# Patient Record
Sex: Female | Born: 1975 | Race: Black or African American | Hispanic: No | Marital: Single | Smoking: Current every day smoker
Health system: Southern US, Community
[De-identification: ages and names within clinical notes are randomized; demographics above are authoritative.]

## PROBLEM LIST (undated history)

## (undated) DIAGNOSIS — K589 Irritable bowel syndrome without diarrhea: Secondary | ICD-10-CM

## (undated) DIAGNOSIS — I2699 Other pulmonary embolism without acute cor pulmonale: Secondary | ICD-10-CM

## (undated) DIAGNOSIS — F319 Bipolar disorder, unspecified: Secondary | ICD-10-CM

## (undated) DIAGNOSIS — K219 Gastro-esophageal reflux disease without esophagitis: Secondary | ICD-10-CM

## (undated) DIAGNOSIS — F449 Dissociative and conversion disorder, unspecified: Secondary | ICD-10-CM

## (undated) DIAGNOSIS — J45909 Unspecified asthma, uncomplicated: Secondary | ICD-10-CM

## (undated) DIAGNOSIS — F445 Conversion disorder with seizures or convulsions: Secondary | ICD-10-CM

## (undated) DIAGNOSIS — G8929 Other chronic pain: Secondary | ICD-10-CM

## (undated) DIAGNOSIS — F419 Anxiety disorder, unspecified: Secondary | ICD-10-CM

## (undated) DIAGNOSIS — R109 Unspecified abdominal pain: Secondary | ICD-10-CM

## (undated) DIAGNOSIS — F209 Schizophrenia, unspecified: Secondary | ICD-10-CM

## (undated) HISTORY — PX: CHOLECYSTECTOMY: SHX55

## (undated) HISTORY — PX: APPENDECTOMY: SHX54

## (undated) HISTORY — PX: TUBAL LIGATION: SHX77

---

## 2003-03-26 ENCOUNTER — Emergency Department (HOSPITAL_COMMUNITY): Admission: EM | Admit: 2003-03-26 | Discharge: 2003-03-26 | Payer: Self-pay | Admitting: *Deleted

## 2003-04-30 ENCOUNTER — Emergency Department (HOSPITAL_COMMUNITY): Admission: EM | Admit: 2003-04-30 | Discharge: 2003-05-01 | Payer: Self-pay | Admitting: *Deleted

## 2004-02-02 ENCOUNTER — Emergency Department (HOSPITAL_COMMUNITY): Admission: EM | Admit: 2004-02-02 | Discharge: 2004-02-02 | Payer: Self-pay | Admitting: Emergency Medicine

## 2004-05-12 ENCOUNTER — Emergency Department (HOSPITAL_COMMUNITY): Admission: EM | Admit: 2004-05-12 | Discharge: 2004-05-12 | Payer: Self-pay | Admitting: Emergency Medicine

## 2004-05-12 ENCOUNTER — Inpatient Hospital Stay (HOSPITAL_COMMUNITY): Admission: AD | Admit: 2004-05-12 | Discharge: 2004-05-18 | Payer: Self-pay | Admitting: Internal Medicine

## 2004-10-07 ENCOUNTER — Emergency Department (HOSPITAL_COMMUNITY): Admission: EM | Admit: 2004-10-07 | Discharge: 2004-10-07 | Payer: Self-pay | Admitting: Emergency Medicine

## 2005-06-05 ENCOUNTER — Emergency Department (HOSPITAL_COMMUNITY): Admission: EM | Admit: 2005-06-05 | Discharge: 2005-06-05 | Payer: Self-pay | Admitting: Emergency Medicine

## 2007-04-21 ENCOUNTER — Emergency Department (HOSPITAL_COMMUNITY): Admission: EM | Admit: 2007-04-21 | Discharge: 2007-04-21 | Payer: Self-pay | Admitting: Emergency Medicine

## 2008-01-30 ENCOUNTER — Emergency Department (HOSPITAL_COMMUNITY): Admission: EM | Admit: 2008-01-30 | Discharge: 2008-01-30 | Payer: Self-pay | Admitting: Emergency Medicine

## 2008-06-18 ENCOUNTER — Emergency Department: Payer: Self-pay | Admitting: Emergency Medicine

## 2008-07-14 ENCOUNTER — Emergency Department (HOSPITAL_COMMUNITY): Admission: EM | Admit: 2008-07-14 | Discharge: 2008-07-14 | Payer: Self-pay | Admitting: Emergency Medicine

## 2008-12-26 ENCOUNTER — Inpatient Hospital Stay (HOSPITAL_COMMUNITY): Admission: EM | Admit: 2008-12-26 | Discharge: 2008-12-30 | Payer: Self-pay | Admitting: Internal Medicine

## 2008-12-26 ENCOUNTER — Encounter: Payer: Self-pay | Admitting: Emergency Medicine

## 2009-03-26 ENCOUNTER — Emergency Department (HOSPITAL_COMMUNITY): Admission: EM | Admit: 2009-03-26 | Discharge: 2009-03-26 | Payer: Self-pay | Admitting: Emergency Medicine

## 2009-09-18 ENCOUNTER — Emergency Department (HOSPITAL_COMMUNITY): Admission: EM | Admit: 2009-09-18 | Discharge: 2009-09-18 | Payer: Self-pay | Admitting: Emergency Medicine

## 2009-11-23 ENCOUNTER — Emergency Department (HOSPITAL_COMMUNITY): Admission: EM | Admit: 2009-11-23 | Discharge: 2009-11-23 | Payer: Self-pay | Admitting: Emergency Medicine

## 2010-01-06 ENCOUNTER — Telehealth (INDEPENDENT_AMBULATORY_CARE_PROVIDER_SITE_OTHER): Payer: Self-pay

## 2010-01-07 ENCOUNTER — Emergency Department (HOSPITAL_COMMUNITY): Admission: EM | Admit: 2010-01-07 | Discharge: 2010-01-07 | Payer: Self-pay | Admitting: Emergency Medicine

## 2010-01-11 ENCOUNTER — Emergency Department (HOSPITAL_COMMUNITY): Admission: EM | Admit: 2010-01-11 | Discharge: 2010-01-11 | Payer: Self-pay | Admitting: Emergency Medicine

## 2010-01-12 ENCOUNTER — Encounter (INDEPENDENT_AMBULATORY_CARE_PROVIDER_SITE_OTHER): Payer: Self-pay | Admitting: *Deleted

## 2010-01-22 DIAGNOSIS — R51 Headache: Secondary | ICD-10-CM

## 2010-01-22 DIAGNOSIS — R109 Unspecified abdominal pain: Secondary | ICD-10-CM | POA: Insufficient documentation

## 2010-01-22 DIAGNOSIS — R519 Headache, unspecified: Secondary | ICD-10-CM | POA: Insufficient documentation

## 2010-01-22 DIAGNOSIS — E876 Hypokalemia: Secondary | ICD-10-CM | POA: Insufficient documentation

## 2010-01-25 ENCOUNTER — Ambulatory Visit: Payer: Self-pay | Admitting: Gastroenterology

## 2010-01-25 DIAGNOSIS — J45909 Unspecified asthma, uncomplicated: Secondary | ICD-10-CM | POA: Insufficient documentation

## 2010-01-25 DIAGNOSIS — K5909 Other constipation: Secondary | ICD-10-CM

## 2010-01-25 DIAGNOSIS — D649 Anemia, unspecified: Secondary | ICD-10-CM

## 2010-01-25 DIAGNOSIS — K219 Gastro-esophageal reflux disease without esophagitis: Secondary | ICD-10-CM | POA: Insufficient documentation

## 2010-01-25 DIAGNOSIS — F319 Bipolar disorder, unspecified: Secondary | ICD-10-CM

## 2010-01-25 DIAGNOSIS — F411 Generalized anxiety disorder: Secondary | ICD-10-CM | POA: Insufficient documentation

## 2010-01-25 DIAGNOSIS — R011 Cardiac murmur, unspecified: Secondary | ICD-10-CM

## 2010-01-25 DIAGNOSIS — F200 Paranoid schizophrenia: Secondary | ICD-10-CM | POA: Insufficient documentation

## 2010-02-03 ENCOUNTER — Encounter: Payer: Self-pay | Admitting: Urgent Care

## 2010-02-10 ENCOUNTER — Telehealth (INDEPENDENT_AMBULATORY_CARE_PROVIDER_SITE_OTHER): Payer: Self-pay

## 2010-02-11 ENCOUNTER — Encounter: Payer: Self-pay | Admitting: Internal Medicine

## 2010-02-17 ENCOUNTER — Emergency Department (HOSPITAL_COMMUNITY): Admission: EM | Admit: 2010-02-17 | Discharge: 2010-02-18 | Payer: Self-pay | Admitting: Emergency Medicine

## 2010-02-19 ENCOUNTER — Ambulatory Visit: Payer: Self-pay | Admitting: Internal Medicine

## 2010-02-19 ENCOUNTER — Ambulatory Visit (HOSPITAL_COMMUNITY): Admission: RE | Admit: 2010-02-19 | Discharge: 2010-02-19 | Payer: Self-pay | Admitting: Internal Medicine

## 2010-02-24 ENCOUNTER — Encounter: Payer: Self-pay | Admitting: Internal Medicine

## 2010-11-09 NOTE — Progress Notes (Signed)
Summary: phone note/constipation  Phone Note Call from Patient   Caller: Patient Summary of Call: VM from Pt. Said she had to cancel her appt (with Dr. Jena Gauss) yesterday. Said she had a death in her family. She has rescheduled for 01/25/2010 with Lorenza Burton, NP. She said she is having issues with constipation and wanted to know what she can do. I called her and told her that we can not prescibe for her since she is not a pt yet. Advise her to check with PCP until her appt here.  Initial call taken by: Cloria Spring LPN,  January 06, 2010 8:48 AM     Appended Document: phone note/constipation fully agree  Appended Document: phone note/constipation Pt stated she would like to be seen ASAP.Marland KitchenMarland KitchenI had a cancellation with Dr Darrick Penna on [facility] 01/13/10.Pt. stated she would like to see Dr Darrick Penna..Pt has never been seen here before.  Appended Document: phone note/constipation Seeing pt for add on procedure. 830 slot not available.  Appended Document: phone note/constipation pt rescheduled to 01/25/10 with KJ for Dr Jena Gauss.Marland KitchenMarland KitchenDr Jena Gauss saw the pt in 2005 as an inpatient.

## 2010-11-09 NOTE — Assessment & Plan Note (Signed)
Summary: npp,persistent rt sided building. glu   Visit Type:  Initial Consult Referring Provider:  Dr. Emelda Fear Primary Care Provider:  Health Department  Chief Complaint:  persistent right side pain.  History of Present Illness: 35 y/o female referred for chronic abd pain.  Pt c/o chronic constipation/IBS.  No BM x 17 days.  Went to ER, was given laxative, no BM.  Took colon prep 2-3 weeks ago.  c/o hard stools small ball-like.  c/o nausea & vomiting intermitantly.  "Scared to eat"  Weight gain 16# in 1-2 weeks per pt. c/o RUQ abd pain, radiates around to LLQ.  constant stabbing pain, 10/10 daily.  s/p appendectomy 2002 & FAILED tubal ligation 1999.  U Preg at Dr Emelda Fear in 3/2011negative per pt.   Occasional heartburn, denies dysphagia or odynophagia  5x/week, does not take meds for this.  Taking tylenol for pain, cannot sleep at night without tylenol PM.  Evaluation by Dr Emelda Fear reveals no gyn etiology.  CT abd/pelvis w/IV contrast 01/11/2010->Tiny umbilical hernia containing fat.   Small amount nonspecific low attenuation free pelvic fluid.   Question small right ovarian cyst versus loculated fluid in right   adnexa.   Question prior appendectomy  It is notable that pt has had A/P CT's in April, Feb 2011, Dec 2010, & Mar 2010. 4/4 U hcg negative, UA negative, CMP, lipase normal WBC 5.6, HGB 11.7, MCV 34.3, MCV 87.5, 296  Current Problems (verified): 1)  Anxiety  (ICD-300.00) 2)  Bipolar Affective Disorder  (ICD-296.80) 3)  Schizophrenia, Paranoid  (ICD-295.30) 4)  Asthma  (ICD-493.90) 5)  Gerd  (ICD-530.81) 6)  Constipation, Chronic  (ICD-564.09) 7)  Heart Murmur, Benign  (ICD-785.2) 8)  Hypokalemia  (ICD-276.8) 9)  Abdominal Pain  (ICD-789.00) 10)  Hx of Headache  (ICD-784.0)  Current Medications (verified): 1)  None  Allergies (verified): 1)  ! Amoxicillin 2)  ! Ibuprofen 3)  ! Sulfa  Past History:  Past Medical History: ANXIETY (ICD-300.00) BIPOLAR AFFECTIVE  DISORDER (ICD-296.80) SCHIZOPHRENIA, PARANOID (ICD-295.30) ASTHMA (ICD-493.90) GERD (ICD-530.81) CONSTIPATION, CHRONIC (ICD-564.09) HEART MURMUR, BENIGN (ICD-785.2) HYPOKALEMIA (ICD-276.8) ABDOMINAL PAIN (ICD-789.00) Hx of HEADACHE (ICD-784.0)  Past Surgical History: TUBAL LIGATION 1999 (pt states failed, pregnant w/ twins post procedure and had SAB) APPENDECTOMY 2002  Family History: No known family history of colorectal carcinoma, IBD, liver or chronic GI problems. Father: (84) HTN Mother:(deceased 1's) massive MI/CVA   Siblings: lost 1 brother MVA,  1 brother-htn  Social History: Engaged to be married this summer lives w/ significant other Has 3 healthy children who live w/ pt's father & step-mother  Patient currently smokes. 22pkyr hx Hx heavy alcohol x 12 yrs, 3 6pks/day, quit 6 yrs ago Hx crack/cocaine use, none in 6 yrs   Smoking Status:  current  Review of Systems      See HPI General:  Denies fever, chills, sweats, anorexia, fatigue, weakness, malaise, weight loss, and sleep disorder. CV:  Denies chest pains, angina, palpitations, syncope, dyspnea on exertion, orthopnea, PND, peripheral edema, and claudication. Resp:  Denies dyspnea at rest, dyspnea with exercise, cough, sputum, wheezing, coughing up blood, and pleurisy. GI:  Denies difficulty swallowing, pain on swallowing, jaundice, bloody BM's, black BMs, and fecal incontinence. GU:  Denies urinary burning, blood in urine, nocturnal urination, urinary frequency, urinary incontinence, abnormal vaginal bleeding, amenorrhea, menorrhagia, and vaginal discharge. Derm:  Denies rash, itching, dry skin, hives, moles, warts, and unhealing ulcers. Psych:  Denies depression, anxiety, memory loss, suicidal ideation, hallucinations, paranoia, phobia, and confusion. Heme:  Denies bruising, bleeding, and enlarged lymph nodes.  Vital Signs:  Patient profile:   35 year old female Height:      68 inches Weight:      266  pounds BMI:     40.59 Temp:     97.7 degrees F oral Pulse rate:   80 / minute BP sitting:   110 / 70  (left arm) Cuff size:   large  Vitals Entered By: Cloria Spring LPN (January 25, 2010 2:18 PM)  Physical Exam  General:  Obese, no acute distress. Head:  Normocephalic and atraumatic. Eyes:  Sclera clear, no icterus. Ears:  Normal auditory acuity. Nose:  No deformity, discharge,  or lesions. Mouth:  No deformity or lesions, dentition normal. Neck:  Supple; no masses or thyromegaly. Lungs:  Clear throughout to auscultation. Heart:  Regular rate and rhythm; no murmurs, rubs,  or bruits. Abdomen:  Obese, Soft, nontender and nondistended. No masses, hepatosplenomegaly or hernias noted. Normal bowel sounds.without guarding and without rebound.   Rectal:  hemocult negative.  No external/internal lesions/masses noted. Msk:  Symmetrical with no gross deformities. Normal posture. Pulses:  Normal pulses noted. Extremities:  No clubbing, cyanosis, edema or deformities noted. Neurologic:  Alert and  oriented x4;  grossly normal neurologically. Skin:  Intact without significant lesions or rashes. Cervical Nodes:  No significant cervical adenopathy. Psych:  Alert and cooperative. Normal mood and affect.  Impression & Recommendations:  Problem # 1:  CONSTIPATION, CHRONIC (ICD-564.09) 35 Y/O Black female w/ chronic constipation and abd pain, previous work-up w/ CT and GYN evaluation negative.  I suspect functional component/IBS.  She has normocytic anemia, hemoccult negative stool today.  Will attempt to treat constipation w/ miralax.    Orders: Consultation Level III ([address])  Problem # 2:  ABDOMINAL PAIN (ICD-789.00) See #1  Problem # 3:  ANEMIA (ICD-285.9) See #1  Patient Instructions: 1)  iFOBT 2)  Begin glycolax, if no improvement would try amitiza. 3)  Samples Restora once daily given 4)  Call if pain persists. 5)  Office visit Dr Jena Gauss 60month 6)  Consider checking TSH and repeat  CBC at that time. Prescriptions: RESTORA  CAPS (PROBIOTIC PRODUCT) begin once po daily  #31 x 2   Entered and Authorized by:   Joselyn Arrow FNP-BC   Signed by:   Joselyn Arrow FNP-BC on 01/27/2010   Method used:   Electronically to        [facility] (retail)       [address]       [place], [place]  [address]       Ph: [phone]       Fax: [phone]   RxID:   [id number] GLYCOLAX  POWD (POLYETHYLENE GLYCOL 3350) 17 grams by mouth daily as needed for constipation, can take 5 tsp in 4oz q hr x 5 hrs until BM today  #527 grams x 11   Entered and Authorized by:   Joselyn Arrow FNP-BC   Signed by:   Joselyn Arrow FNP-BC on 01/27/2010   Method used:   Electronically to        [facility] (retail)       [address]       [place], [place]  [address]       Ph: [phone]       Fax: [phone]  RxID:   [id number]   Appended Document: Orders Update    Clinical Lists Changes  Orders: Added new Service order of Hemoccult Cards -3 specimans (take home) (93235) - Signed      Appended Document: npp,persistent rt sided building. glu She needs a tcs; please set up for next week  Appended Document: npp,persistent rt sided building. glu Pt informed.   Appended Document: npp,persistent rt sided building. glu I called pt to schedule tcs,no answer,lmom.  Appended Document: npp,persistent rt sided building. glu TCS planned given new onset bloody stools  Appended Document: npp,persistent rt sided building. glu pt aware of appt w/RMR on 03/02/10@4pm

## 2010-11-09 NOTE — Letter (Signed)
Summary: TCS ORDER  TCS ORDER   Imported By: Ave Filter 02/11/2010 11:24:28  _____________________________________________________________________  External Attachment:    Type:   Image     Comment:   External Document

## 2010-11-09 NOTE — Letter (Signed)
Summary: Patient Notice, Colon Biopsy Results  [facility]  [address]   [place], [place] [address]   Phone: [phone]  Fax: [phone]       Feb 24, 2010   Leah Gardner [address] [place], [place]  [address] 12/31/1975    Dear Ms. Gardner,  I am pleased to inform you that the biopsies taken during your recent colonoscopy did not show any evidence of cancer upon pathologic examination.  Additional information/recommendations:  You should have a repeat colonoscopy examination  age 13.  Please call us if you are having persistent problems or have questions about your condition that have not been fully answered at this time.  Sincerely,    R. Roetta Sessions MD, FACP [facility]  [facility] Ph: [phone]    Fax: [phone]   Appended Document: Patient Notice, Colon Biopsy Results rEMINDER PLACED IN IDX.

## 2010-11-09 NOTE — Progress Notes (Signed)
Summary: phone note/ watery, bloody diarrhea  Phone Note Call from Patient   Caller: Patient Summary of Call: Pt called and said she has been having diarrhea siince about 3:OO AM this morning. She said it is more bloody water than  stool. She was having some abdominal pain, but it some better. Her next appt here is on 5/247/2011 with Dr. Jena Gauss. Please advise! Initial call taken by: Cloria Spring LPN,  Feb 10, 1609 3:04 PM     Appended Document: phone note/ watery, bloody diarrhea would also back off on Gycolax if she has been taking it recently  Appended Document: phone note/ watery, bloody diarrhea Pt informed.

## 2010-11-09 NOTE — Letter (Signed)
Summary: Scheduled Appointment  [facility]  [address]   [place], [place] [address]   Phone: [phone]  Fax: [phone]    January 12, 2010   Dear: Annett Gula PETTIFORD            DOB: 1976-06-26    I have been instructed to schedule you an appointment in our office.  Your appointment is as follows:   Date: April 18,2011  Time: 2:00pm    Please be here 15 minutes early.   Provider: Lorenza Burton FNP    Please contact the office if you need to reschedule this appointment for a more convenient time.   Thank you,    [facility] Ph: [phone]   Fax: [phone]

## 2010-11-09 NOTE — Letter (Signed)
Summary: External Other  External Other   Imported By: Peggyann Shoals 02/03/2010 10:16:41  _____________________________________________________________________  External Attachment:    Type:   Image     Comment:   External Document

## 2010-12-28 LAB — PREGNANCY, URINE: Preg Test, Ur: NEGATIVE

## 2010-12-28 LAB — BASIC METABOLIC PANEL
BUN: 15 mg/dL (ref 6–23)
CO2: 23 mEq/L (ref 19–32)
Calcium: 9.2 mg/dL (ref 8.4–10.5)
Chloride: 111 mEq/L (ref 96–112)
Creatinine, Ser: 0.91 mg/dL (ref 0.4–1.2)
GFR calc Af Amer: >60 mL/min (ref >60)
GFR calc non Af Amer: >60 mL/min (ref >60)
Glucose, Bld: 101 mg/dL — ABNORMAL HIGH (ref 70–99)
Potassium: 3.6 mEq/L (ref 3.5–5.1)
Sodium: 139 mEq/L (ref 135–145)

## 2010-12-28 LAB — URINALYSIS, ROUTINE W REFLEX MICROSCOPIC
Bilirubin Urine: NEGATIVE
Glucose, UA: NEGATIVE mg/dL
Ketones, ur: NEGATIVE mg/dL
Leukocytes, UA: NEGATIVE
Nitrite: NEGATIVE
Protein, ur: NEGATIVE mg/dL
Specific Gravity, Urine: >1.03 — ABNORMAL HIGH (ref 1.005–1.030)
Urobilinogen, UA: 0.2 mg/dL (ref 0.0–1.0)
pH: 5.5 (ref 5.0–8.0)

## 2010-12-28 LAB — WET PREP, GENITAL
Trich, Wet Prep: NONE SEEN
Yeast Wet Prep HPF POC: NONE SEEN

## 2010-12-28 LAB — URINE MICROSCOPIC-ADD ON

## 2010-12-28 LAB — DIFFERENTIAL
Lymphs Abs: 2.9 10*3/uL (ref 0.7–4.0)
Monocytes Relative: 10 % (ref 3–12)
Neutro Abs: 2.2 10*3/uL (ref 1.7–7.7)
Neutrophils Relative %: 38 % — ABNORMAL LOW (ref 43–77)

## 2010-12-28 LAB — CBC
MCV: 86 fL (ref 78.0–100.0)
RBC: 3.82 MIL/uL — ABNORMAL LOW (ref 3.87–5.11)
WBC: 5.8 10*3/uL (ref 4.0–10.5)

## 2010-12-28 LAB — GC/CHLAMYDIA PROBE AMP, GENITAL: Chlamydia, DNA Probe: NEGATIVE

## 2010-12-29 LAB — BASIC METABOLIC PANEL
CO2: 23 mEq/L (ref 19–32)
CO2: 21 mEq/L (ref 19–32)
Chloride: 110 mEq/L (ref 96–112)
GFR calc Af Amer: >60 mL/min (ref >60)
GFR calc non Af Amer: >60 mL/min (ref >60)
Glucose, Bld: 101 mg/dL — ABNORMAL HIGH (ref 70–99)
Potassium: 3.9 mEq/L (ref 3.5–5.1)
Potassium: 3.8 mEq/L (ref 3.5–5.1)
Sodium: 137 mEq/L (ref 135–145)
Sodium: 139 mEq/L (ref 135–145)

## 2010-12-29 LAB — DIFFERENTIAL
Basophils Absolute: 0 10*3/uL (ref 0.0–0.1)
Basophils Relative: 0 % (ref 0–1)
Eosinophils Absolute: 0 10*3/uL (ref 0.0–0.7)
Eosinophils Relative: 1 % (ref 0–5)
Lymphocytes Relative: 48 % — ABNORMAL HIGH (ref 12–46)
Lymphs Abs: 2.5 10*3/uL (ref 0.7–4.0)
Monocytes Absolute: 0.5 10*3/uL (ref 0.1–1.0)
Monocytes Absolute: 0.3 10*3/uL (ref 0.1–1.0)
Monocytes Relative: 9 % (ref 3–12)
Monocytes Relative: 7 % (ref 3–12)

## 2010-12-29 LAB — CBC
HCT: 34.3 % — ABNORMAL LOW (ref 36.0–46.0)
HCT: 36.7 % (ref 36.0–46.0)
Hemoglobin: 11.7 g/dL — ABNORMAL LOW (ref 12.0–15.0)
Hemoglobin: 12.4 g/dL (ref 12.0–15.0)
MCHC: 34.3 g/dL (ref 30.0–36.0)
MCHC: 33.9 g/dL (ref 30.0–36.0)
MCV: 87.7 fL (ref 78.0–100.0)
RBC: 3.92 MIL/uL (ref 3.87–5.11)
RBC: 4.18 MIL/uL (ref 3.87–5.11)
RDW: 15.3 % (ref 11.5–15.5)
WBC: 4.1 10*3/uL (ref 4.0–10.5)

## 2010-12-29 LAB — GC/CHLAMYDIA PROBE AMP, GENITAL
Chlamydia, DNA Probe: NEGATIVE
GC Probe Amp, Genital: NEGATIVE

## 2010-12-29 LAB — URINALYSIS, ROUTINE W REFLEX MICROSCOPIC
Bilirubin Urine: NEGATIVE
Glucose, UA: NEGATIVE mg/dL
Hgb urine dipstick: NEGATIVE
Hgb urine dipstick: NEGATIVE
Protein, ur: NEGATIVE mg/dL
Specific Gravity, Urine: 1.01 (ref 1.005–1.030)
pH: 6 (ref 5.0–8.0)
pH: 6 (ref 5.0–8.0)

## 2010-12-29 LAB — HEPATIC FUNCTION PANEL
Alkaline Phosphatase: 31 U/L — ABNORMAL LOW (ref 39–117)
Bilirubin, Direct: 0.2 mg/dL (ref 0.0–0.3)
Total Bilirubin: 0.9 mg/dL (ref 0.3–1.2)

## 2010-12-29 LAB — WET PREP, GENITAL
Trich, Wet Prep: NONE SEEN
Yeast Wet Prep HPF POC: NONE SEEN

## 2011-01-02 LAB — URINALYSIS, ROUTINE W REFLEX MICROSCOPIC
Glucose, UA: NEGATIVE mg/dL
Hgb urine dipstick: NEGATIVE
Protein, ur: NEGATIVE mg/dL
Specific Gravity, Urine: 1.015 (ref 1.005–1.030)
Urobilinogen, UA: 0.2 mg/dL (ref 0.0–1.0)

## 2011-01-02 LAB — CBC
MCV: 87.2 fL (ref 78.0–100.0)
Platelets: 302 10*3/uL (ref 150–400)
RBC: 3.8 MIL/uL — ABNORMAL LOW (ref 3.87–5.11)
WBC: 6.5 10*3/uL (ref 4.0–10.5)

## 2011-01-02 LAB — COMPREHENSIVE METABOLIC PANEL
ALT: 17 U/L (ref 0–35)
AST: 15 U/L (ref 0–37)
Albumin: 3.5 g/dL (ref 3.5–5.2)
Alkaline Phosphatase: 39 U/L (ref 39–117)
CO2: 24 mEq/L (ref 19–32)
Chloride: 110 mEq/L (ref 96–112)
GFR calc Af Amer: >60 mL/min (ref >60)
GFR calc non Af Amer: >60 mL/min (ref >60)
Potassium: 3.6 mEq/L (ref 3.5–5.1)
Total Bilirubin: 0.7 mg/dL (ref 0.3–1.2)

## 2011-01-02 LAB — DIFFERENTIAL
Basophils Absolute: 0 10*3/uL (ref 0.0–0.1)
Basophils Relative: 0 % (ref 0–1)
Eosinophils Absolute: 0.1 10*3/uL (ref 0.0–0.7)
Eosinophils Relative: 1 % (ref 0–5)
Monocytes Absolute: 0.4 10*3/uL (ref 0.1–1.0)

## 2011-01-02 LAB — LIPASE, BLOOD: Lipase: 42 U/L (ref 11–59)

## 2011-01-11 LAB — URINALYSIS, ROUTINE W REFLEX MICROSCOPIC
Bilirubin Urine: NEGATIVE
Ketones, ur: NEGATIVE mg/dL
Nitrite: NEGATIVE
Protein, ur: NEGATIVE mg/dL
pH: 6 (ref 5.0–8.0)

## 2011-01-11 LAB — DIFFERENTIAL
Lymphs Abs: 2.7 10*3/uL (ref 0.7–4.0)
Monocytes Relative: 8 % (ref 3–12)
Neutro Abs: 2.1 10*3/uL (ref 1.7–7.7)
Neutrophils Relative %: 40 % — ABNORMAL LOW (ref 43–77)

## 2011-01-11 LAB — CBC
Hemoglobin: 11.4 g/dL — ABNORMAL LOW (ref 12.0–15.0)
MCHC: 33.3 g/dL (ref 30.0–36.0)
Platelets: 259 10*3/uL (ref 150–400)
RDW: 15 % (ref 11.5–15.5)

## 2011-01-11 LAB — COMPREHENSIVE METABOLIC PANEL
BUN: 14 mg/dL (ref 6–23)
Calcium: 8.7 mg/dL (ref 8.4–10.5)
Glucose, Bld: 92 mg/dL (ref 70–99)
Sodium: 141 mEq/L (ref 135–145)
Total Protein: 6.8 g/dL (ref 6.0–8.3)

## 2011-01-11 LAB — PREGNANCY, URINE: Preg Test, Ur: NEGATIVE

## 2011-01-20 LAB — BASIC METABOLIC PANEL
BUN: 10 mg/dL (ref 6–23)
BUN: 4 mg/dL — ABNORMAL LOW (ref 6–23)
CO2: 22 mEq/L (ref 19–32)
CO2: 22 mEq/L (ref 19–32)
CO2: 25 mEq/L (ref 19–32)
Calcium: 8.5 mg/dL (ref 8.4–10.5)
Calcium: 8.8 mg/dL (ref 8.4–10.5)
Chloride: 100 mEq/L (ref 96–112)
Chloride: 101 mEq/L (ref 96–112)
Chloride: 107 mEq/L (ref 96–112)
Chloride: 111 mEq/L (ref 96–112)
Creatinine, Ser: 1.16 mg/dL (ref 0.4–1.2)
Creatinine, Ser: 0.96 mg/dL (ref 0.4–1.2)
Creatinine, Ser: 1.02 mg/dL (ref 0.4–1.2)
Creatinine, Ser: 1.1 mg/dL (ref 0.4–1.2)
GFR calc Af Amer: >60 mL/min (ref >60)
GFR calc Af Amer: >60 mL/min (ref >60)
GFR calc Af Amer: >60 mL/min (ref >60)
GFR calc non Af Amer: 54 mL/min — ABNORMAL LOW (ref >60)
GFR calc non Af Amer: 57 mL/min — ABNORMAL LOW (ref >60)
GFR calc non Af Amer: >60 mL/min (ref >60)
GFR calc non Af Amer: >60 mL/min (ref >60)
Glucose, Bld: 103 mg/dL — ABNORMAL HIGH (ref 70–99)
Glucose, Bld: 104 mg/dL — ABNORMAL HIGH (ref 70–99)
Glucose, Bld: 90 mg/dL (ref 70–99)
Potassium: 3.8 mEq/L (ref 3.5–5.1)
Potassium: 3.9 mEq/L (ref 3.5–5.1)
Sodium: 137 mEq/L (ref 135–145)

## 2011-01-20 LAB — COMPREHENSIVE METABOLIC PANEL
AST: 15 U/L (ref 0–37)
Albumin: 3 g/dL — ABNORMAL LOW (ref 3.5–5.2)
Alkaline Phosphatase: 45 U/L (ref 39–117)
Chloride: 105 mEq/L (ref 96–112)
GFR calc Af Amer: >60 mL/min (ref >60)
Potassium: 3.9 mEq/L (ref 3.5–5.1)
Total Bilirubin: 0.8 mg/dL (ref 0.3–1.2)
Total Protein: 7 g/dL (ref 6.0–8.3)

## 2011-01-20 LAB — CBC
HCT: 28.5 % — ABNORMAL LOW (ref 36.0–46.0)
Hemoglobin: 9.7 g/dL — ABNORMAL LOW (ref 12.0–15.0)
Hemoglobin: 9.7 g/dL — ABNORMAL LOW (ref 12.0–15.0)
MCHC: 34 g/dL (ref 30.0–36.0)
MCHC: 34.1 g/dL (ref 30.0–36.0)
MCHC: 34.3 g/dL (ref 30.0–36.0)
MCV: 87.5 fL (ref 78.0–100.0)
MCV: 88 fL (ref 78.0–100.0)
MCV: 88.1 fL (ref 78.0–100.0)
MCV: 88.3 fL (ref 78.0–100.0)
Platelets: 222 10*3/uL (ref 150–400)
Platelets: 183 10*3/uL (ref 150–400)
Platelets: 197 10*3/uL (ref 150–400)
Platelets: 314 10*3/uL (ref 150–400)
RBC: 3.23 MIL/uL — ABNORMAL LOW (ref 3.87–5.11)
RBC: 3.58 MIL/uL — ABNORMAL LOW (ref 3.87–5.11)
RDW: 14.7 % (ref 11.5–15.5)
RDW: 14.9 % (ref 11.5–15.5)
RDW: 14.9 % (ref 11.5–15.5)
WBC: 13.7 10*3/uL — ABNORMAL HIGH (ref 4.0–10.5)
WBC: 12.4 10*3/uL — ABNORMAL HIGH (ref 4.0–10.5)

## 2011-01-20 LAB — CULTURE, BLOOD (ROUTINE X 2)
Culture: NO GROWTH
Culture: NO GROWTH
Report Status: 3242010

## 2011-01-20 LAB — URINE MICROSCOPIC-ADD ON

## 2011-01-20 LAB — GC/CHLAMYDIA PROBE AMP, GENITAL: GC Probe Amp, Genital: NEGATIVE

## 2011-01-20 LAB — PREGNANCY, URINE: Preg Test, Ur: NEGATIVE

## 2011-01-20 LAB — HEPATITIS PANEL, ACUTE
HCV Ab: NEGATIVE
Hep A IgM: NEGATIVE
Hepatitis B Surface Ag: NEGATIVE

## 2011-01-20 LAB — GC PROBE AMPLIFICATION, URINE: GC Probe Amp, Urine: NEGATIVE

## 2011-01-20 LAB — URINALYSIS, ROUTINE W REFLEX MICROSCOPIC
Bilirubin Urine: NEGATIVE
Glucose, UA: NEGATIVE mg/dL
Nitrite: POSITIVE — AB
Specific Gravity, Urine: 1.015 (ref 1.005–1.030)
Specific Gravity, Urine: 1.025 (ref 1.005–1.030)
Urobilinogen, UA: 0.2 mg/dL (ref 0.0–1.0)
pH: 5.5 (ref 5.0–8.0)
pH: 6 (ref 5.0–8.0)

## 2011-01-20 LAB — URINE CULTURE

## 2011-01-20 LAB — DIFFERENTIAL
Basophils Absolute: 0 10*3/uL (ref 0.0–0.1)
Eosinophils Absolute: 0 10*3/uL (ref 0.0–0.7)
Lymphs Abs: 1.1 10*3/uL (ref 0.7–4.0)
Neutrophils Relative %: 84 % — ABNORMAL HIGH (ref 43–77)

## 2011-01-20 LAB — RPR: RPR Ser Ql: NONREACTIVE

## 2011-01-20 LAB — WET PREP, GENITAL: Yeast Wet Prep HPF POC: NONE SEEN

## 2011-01-20 LAB — T4, FREE: Free T4: 0.82 ng/dL — ABNORMAL LOW (ref 0.89–1.80)

## 2011-01-20 LAB — CHLAMYDIA PROBE AMPLIFICATION, URINE: Chlamydia, Swab/Urine, PCR: NEGATIVE

## 2011-02-22 NOTE — H&P (Signed)
Leah Gardner, MARSCHNER            ACCOUNT NO.:  [id number]   MEDICAL RECORD NO.:  [id number]          PATIENT TYPE:  INP   LOCATION:  5158                         FACILITY:  [facility]   PHYSICIAN:  Elliot Cousin, M.D.    DATE OF BIRTH:  12/07/1975   DATE OF ADMISSION:  12/26/2008  DATE OF DISCHARGE:                              HISTORY & PHYSICAL   PRIMARY CARE PHYSICIAN:  The patient is unassigned.   .   CHIEF COMPLAINT:  Painful urination, left pelvic and flank pain, chills,  nausea and vomiting.  The patient was transferred from [facility] Emergency Department for further evaluation of an ovarian mass,  query tuboovarian abscess.   HISTORY OF PRESENT ILLNESS:  The patient is a 35 year old woman with a  past medical history significant for pyelonephritis, urinary tract  infections and gestational diabetes mellitus.  She presented to the  emergency department at [facility] this morning with a chief  complaint of painful urination, left pelvic and flank pain, chills,  nausea and vomiting.  Her symptoms started approximately 3 days ago.  They became progressively worse.  The pain is described as sharp and is  primarily located in the lower left abdomen and left flank area.  She  says at times, the pain is severe.  Initially, the pain was  intermittent.  However, it has been constant over the past 24 hours.  She denies vaginal or urethral discharge.  She does have pain with  urination, but no burning or itching.  She has had no associated  diarrhea, constipation or bright red blood per rectum.  She has had  several episodes of nausea and vomiting without any evidence of coffee-  grounds emesis.  Her last menstrual period was in December 2009.  Her  last sexual encounter was in January 2010.  She says that her menstrual  periods have always been irregular.  She does have a history of a  bilateral tubal ligation.   During the evaluation by the emergency department  physician and nurse  practitioner at [facility], it was noted that the patient was  febrile with a temperature 101.8 and initially tachycardiac with a heart  rate of 111.  Her urine pregnancy test was negative.  Her urinalysis  revealed moderate blood, 100 protein, positive nitrites and moderate  leukocytes.  A pelvic exam was performed and the wet prep revealed a few  trichomonads,  many clue cells, and few WBCs.  A pelvic ultrasound was  ordered and the results revealed a small amount of free fluid is seen in  the adjacent areas to both ovaries as well as within the cul-de-sac; the  fluid adjacent to the left ovary appears slightly complex; 1.8 cm  slightly complex left ovarian cystic structure; adjacent to the left  ovary is a small complex cystic/septated structure which is not part of  the ovary and does not appear to be the tube; exact etiology is  indeterminate.  Because there was a concern about a possible tuboovarian  abscess, the patient was transferred to [facility] where a  gynecological evaluation  can be obtained.   PAST MEDICAL HISTORY:  1. History of gestational diabetes.  2. History of urinary tract infections and kidney infections,      particularly during her previous pregnancies.  3. Tobacco use.  4. History of alcohol abuse, she stopped approximately 1 year ago.  5. History of migraine headaches  6. Status post bilateral tubal ligation.  7. Status post appendectomy.   MEDICATIONS:  None prescribed.  However, she has taken over-the-counter  Tylenol for several days with minimal relief.   ALLERGIES:  1. The patient has an allergy to [facility] which causes hives.  2. She also has a allergy to SULFA DRUGS which also causes hives.   SOCIAL HISTORY:  The patient is single.  [place]  She has 3 children.  She smokes 1 pack of cigarettes per day.  She has been abstinent from alcohol use for 1 year.  In the past, she   would drink 2 six-packs of beer daily.  She is currently unemployed.  She receives financial support from her father.   FAMILY HISTORY:  Her father is 85 years of age and has hypertension.  Her mother died of a myocardial infarction and stroke at 36 years of  age.   REVIEW OF SYSTEMS:  As above in history of present illness.   PHYSICAL EXAMINATION:  VITAL SIGNS:  Temperature 102.0, pulse 106,  respiratory rate 20, blood pressure 114/75, oxygen saturation 98% on  room air.  GENERAL:  The patient is a pleasant 34 year old African American woman  who is currently lying in bed in no acute distress, although she does  appear ill.  HEENT:  Head is normocephalic nontraumatic.  Pupils are equal, round,  reactive to light.  Extraocular muscles are intact.  Conjunctivae are  clear.  Sclerae are white.  Tympanic membranes not examined.  Nasal  mucosa is dry.  No sinus tenderness.  Oropharynx reveals mildly dry  mucous membranes.  No posterior exudates or erythema.  NECK:  Supple.  No adenopathy, no thyromegaly, no bruit, no JVD.  LUNGS:  Clear to auscultation bilaterally.  HEART:  S1-S2 with no murmurs, rubs or gallops.  ABDOMEN:  Mildly obese, positive bowel sounds, soft and diffusely tender  with  greater tenderness over the left flank area and the left  suprapubic areas.  No rebound, no guarding and no obvious distention.  GU/RECTAL:  Deferred.  According to the notes from [facility],  the patient did have cervical motion tenderness and left adnexal  tenderness.  EXTREMITIES:  Pedal pulses palpable.  No pretibial edema.  No pedal  edema.  NEUROLOGIC:  The patient is alert and oriented x3.  Cranial  nerves II-XII are intact.  Strength is 5/5 throughout.  Sensation is  intact.   LABORATORY DATA:  Admission laboratories (taken at [facility]  Emergency Department):  Blood culture is pending.  Urinalysis results  are above.  Wet prep results are above.  Urine pregnancy test  negative.  Sodium 136, potassium 3.9, CO2 25, glucose 96, BUN 10, creatinine 1.13,  total bilirubin 0.8, alkaline phosphatase 45, SGOT 15, SGPT 15, total  protein 7.0, albumin 3.0, calcium 8.8, WBC 13.7, hemoglobin 11.8,  platelets 222.   ASSESSMENT:  1. Acute pyelonephritis.  The urine culture is pending.  2. A 1.8 centimeter dominant complex septated-appearing cystic      structure of the left ovary with an adjacent small complex cystic      septated  structure that is not part of the ovary per pelvic      ultrasound.  There is a concern of a possible tuboovarian abscess.  3. Bacterial vaginosis and Trichomonas vaginitis.  4. Fever and leukocytosis secondary to the infectious processes.  5. Tobacco abuse.   PLAN:  1. The patient was given Unasyn and doxycycline by the [facility] Emergency Department physicians.  2. We will start broad-spectrum antibiotic treatment with Zosyn.  3. We will continue IV fluid volume repletion with normal saline.  The      patient's pain will be treated with as needed Tylenol, as needed      oxycodone and as needed morphine.  Supportive treatment will be      provided with Phenergan and Zofran for nausea as needed.  4. Gynecologist, Dr. Ernestina Penna, has been consulted.  5. A clear liquid diet will be started with no advancement yet.  6. The patient was advised to stop smoking.  We will order official      tobacco cessation counseling.  7. For further evaluation, we will check the results of the blood      cultures now pending, urine culture pending, and GC and chlamydia      result pending.  In addition, we will check an RPR, HIV, an acute      viral hepatitis panel for an STD work-up.  8. We will hold on treatment of Trichomonas/bacterial vaginosis until      the patient is able to tolerate oral Flagyl without nausea and      vomiting.      Elliot Cousin, M.D.  Electronically Signed     DF/MEDQ  D:  12/26/2008  T:  12/27/2008  Job:   [id number]

## 2011-02-22 NOTE — Discharge Summary (Signed)
Leah Gardner, Leah Gardner            ACCOUNT NO.:  [id number]   MEDICAL RECORD NO.:  [id number]          PATIENT TYPE:  INP   LOCATION:  5158                         FACILITY:  [facility]   PHYSICIAN:  Isidor Holts, M.D.  DATE OF BIRTH:  December 11, 1975   DATE OF ADMISSION:  12/26/2008  DATE OF DISCHARGE:  12/30/2008                               DISCHARGE SUMMARY   PMD:  Unassigned.   DISCHARGE DIAGNOSES:  1. Acute pyelonephritis.  2. Pelvic inflammatory disease.  3. Trichomoniasis.  4. Smoking history.   DISCHARGE MEDICATIONS:  1. Flagyl 500 mg p.o. t.i.d. with food for 5 days only.  2. Augmentin 875 mg p.o. b.i.d. for 5 days.  3. Vicodin (5/500) one p.o. p.r.n. q. 6 hourly.  A total of 20 pills      have been dispensed.   PROCEDURES:  1. Transabdominal/pelvic ultrasound scan dated December 26, 2008 that      showed a small amount of free fluid adjacent to both ovaries as      well as in the cul-de-sac just adjacent to the left ovary that      appears slightly complex.  There was a 1.8-cm slightly complex left      renal cystic structure.  A well-defined tubo-ovarian abscess is not      identified.  2. Abdominal/pelvic CT scan dated December 29, 2008 that showed no acute      abdominal process evident.  There was a small amount of free fluid      in the posterior cul-de-sac.  This may be related to a ruptured  ovarian follicle or hemorrhagic cyst.  No CT evidence of a tubo-ovarian  abscess or torsion.   CONSULTATION:  Dr. Ernestina Penna, GYN.   ADMISSION HISTORY:  As in the H and P note of December 26, 2008, dictated  by Dr. Elliot Cousin.  However, in brief, this is a 35 year old female,  with a known history of gestational diabetes mellitus, previous urinary  tract infections, smoking history, previous of history of alcohol abuse  who quit approximately 1 year ago, migraine headaches, status post  bilateral tubal ligation, status post appendectomy, presenting via  transfer from [facility] on suspicion of a possible tubo-  ovarian abscess.  She did have a 3-day history of dysuria, left pelvic  and flank pain, chills, nausea and vomiting.  She was admitted for  further evaluation, investigation, and management.   CLINICAL COURSE:  1. Acute pyelonephritis.  For details of presentation, refer to the      admission history above.  The patient did have a 3-day history of      flank pain, chills, dysuria.  Urinalysis demonstrated WBCs  too-      numerous-to-count.  She was commenced on broad-spectrum antibiotic      coverage with intravenous Zosyn for pyelonephritis.  Reportedly,      she had undergone abdominal ultrasounds during her hospitalization      at [facility], and this demonstrated left adnexal findings      which were concerning for possible tubo-ovarian abscess, so she was  transferred to [facility].  STD screen was carried out and      was negative for hepatitis B surface antigen, hepatitis C antibody.      HIV test was negative.  GC/chlamydia probe was negative.  RPR was      negative.  Pregnancy test was also negative.  Wet prep examination      did, however, show trichomonads and clue cells.  She was commenced      on a 7-day course of oral Flagyl.   1. Pelvic inflammatory disease.  Refer to #1 above.  Follow-up      abdominal CT scan showed no evidence of tubo-ovarian abscess,      although there was a small amount of pelvic fluid.  It appears the      patient may have had a follicular or hemorrhagic cyst, which has      now ruptured.  GYN consultation was kindly provided by Dr.      Algie Coffer, who has recommended GYN followup on discharge.   1. Smoking history.  The patient was counseled appropriately.   1. Migraines headaches.  This did not prove troublesome during the      patient's hospitalization.   DISPOSITION:  The patient was on December 30, 2008 considerably clinically  improved, and feeling much better.  Abdominal pain  had ameliorated.  She  had no further recorded pyrexia.  White cell count had normalized and  she was keen to be discharged.  She was therefore, discharged  accordingly.  She was recommended to return to regular duties January 05, 2009.   ACTIVITY:  As tolerated.   DIET:  No restrictions.   FOLLOW-UP INSTRUCTIONS:  The patient is recommended to follow up with  the GYN Clinic at [facility].  She has been supplied appropriate  information and instructed to call for an appointment.      Isidor Holts, M.D.  Electronically Signed     CO/MEDQ  D:  12/30/2008  T:  12/30/2008  Job:  [id number]

## 2011-02-25 NOTE — H&P (Signed)
NAME:  Leah Gardner, Leah Gardner                      ACCOUNT NO.:  [id number]   MEDICAL RECORD NO.:  [id number]                   PATIENT TYPE:  EMS   LOCATION:  ED                                   FACILITY:  [facility]   PHYSICIAN:  Lionel December, M.D.                 DATE OF BIRTH:  Mar 06, 1976   DATE OF ADMISSION:  05/12/2004  DATE OF DISCHARGE:                                HISTORY & PHYSICAL   REASON FOR ADMISSION:  1. Nausea, vomiting, acute onset.  2. Headache.  3. The patient is noted to be hypokalemic in the emergency room.   HISTORY OF PRESENT ILLNESS:  Leah Gardner is a 35 year old African American female  who does not have a primary care physician, who was in her usual state of  health.  When she went to work last night, she worked third shift.  As she  was leaving her work, she more or less suddenly developed nausea followed by  vomiting.  She had multiple episodes of vomiting.  She vomited liquid, scant  amount of coffee-ground.  She did not experience any abdominal pain, fever  or chills.  She came to the emergency room.  She was evaluated by Dr. Rhae Lerner. [place].  The patient also complained to him that something was caught  up in her throat.  He therefore felt the patient needed to undergo  esophagogastroduodenoscopy.  The patient was brought to [facility] in  preparation for the procedure.  When I saw the patient, she was complaining  of photophobia and bifrontal headache, and she was still nauseated.  She  denied prior symptoms of heartburn, hoarseness, chronic cough or dysphagia.  She also did not experience any fevers, chills or skin rash.  There is also  no history of anorexia, weight loss, melena or rectal bleeding.  She denies  dysuria.  Her LMP was one week ago.   MEDICATIONS:  She does not take any medications on a regular basis.   PAST MEDICAL HISTORY:  She had tubal ligation in 1999 and appendectomy in  2000.   ALLERGIES:  None known.   FAMILY HISTORY:   Positive for diabetes mellitus on father's side.  Mother  died of heart disease at age 66.  She has two brothers and two sisters who  are in good health.  One brother died in his 26's of auto accident.   SOCIAL HISTORY:  She is single.  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  She is single.  She has three children in good health, ages 29,  24 and 5 years.  She does not drink alcohol, but has been smoking a pack a  day for the last 10 years.   PHYSICAL EXAMINATION:  GENERAL:  A well-developed, mildly obese, African  American female who is holding an emesis basin and covering her eyes to  prevent light.  VITAL SIGNS:  Admission weight  215 pounds.  She is 5 feet, 8 inches tall.  Pulse 62 per minute.  Blood pressure 92/57.  Respirations 18.  Temperature  97.5.  Conjunctivae is pink.  Sclerae is nonicteric.  Pupils are equal and  reactive to light.  No nystagmus is noted.  Oropharyngeal mucosa is normal.  NECK:  Supple.  No thyromegaly or lymphadenopathy is noted.  LUNGS:  Clear to auscultation.  CARDIAC:  Exam is regular rhythm, normal S1 and S2.  No murmur or gallop is  noted.  LUNGS:  Clear to auscultation.  ABDOMEN:  Full bowel sounds are normal, and palpation reveals soft abdomen  without tenderness, organomegaly or masses.  RECTAL:  Examination is deferred.  SKIN:  She does not have skin rash, clubbing or peripheral edema.   LABORATORY DATA:  Done in ER:  WBC 5.8, H&H is 11.3 and 33.2, MCV is 84.1,  Platelet count is 315,000.  Differential reveals 33% neutrophils, 59%  lymphocytes, 6% monos.  She has 2% eosinophils.  Sodium 138, potassium 2.9,  chloride 111.  CO2 is 23, glucose 101, BUN 6, creatinine 1.0.  Calcium is  8.9.  Bilirubin 1.2, alkaline phosphatase 36, AST 23, ALT 17.  Albumin 3.7.  Total protein 7.3.  Urine pregnancy test is negative.  Urinalysis reveals  specific gravity of 1.025.  Nitrite and leukocyte esterase is negative.  She  has 0-2 wbc's per  high-powered field.   ASSESSMENT:  Leah Gardner is a 34 year old African American female who presents  with acute onset of nausea, vomiting and had some coffee-ground emesis.  She  is now complaining of headache and photophobia.  She is still nauseated,  although she has not vomited in the last hour or so.  She has hypokalemia.  Her neck is supple.   I suspect we are dealing with viral syndrome of some form of  gastroenteritis.  Since she is complaining of severe headache, we need to  make sure we are not dealing with intracranial bleed to account for her  nausea and vomiting.  As for scant hematemesis concern, I feel this is most  likely due to Mallory-Weiss tear.  Feeling of something stuck in her throat  or lump in her throat is felt to be due to acute regurgitation of gastric  contents in her esophagus and pharynx.  I doubt that she has chronic  esophagitis or a stricture, and I feel esophagogastroduodenoscopy can be  delayed until further evaluation is completed.   PLAN:  1. We will start her on IV fluids with KCl.  2. Urine toxicity screen will be obtained.  3. Unenhanced head CT.  4. Start on Protonix 40 mg IV q.24 hours.  5. CBC and Met-7 will be repeated in the a.m.  6. We will also ask the lab to do serum amylase and lipase.     ___________________________________________                                         Lionel December, M.D.   NR/MEDQ  D:  05/12/2004  T:  05/12/2004  Job:  [id number]

## 2011-02-25 NOTE — Consult Note (Signed)
NAME:  Leah Gardner, Leah Gardner                      ACCOUNT NO.:  [id number]   MEDICAL RECORD NO.:  [id number]                   PATIENT TYPE:  INP   LOCATION:  A332                                 FACILITY:  [facility]   PHYSICIAN:  Kofi A. Gerilyn Pilgrim, M.D.              DATE OF BIRTH:  April 25, 1976   DATE OF CONSULTATION:  DATE OF DISCHARGE:                                   CONSULTATION   REASON FOR CONSULTATION:  Headache and altered mental status.   IMPRESSION:  Acute onset of headache etiology unclear.  Given the character  of the headache with maximum severity and onset, subarachnoid hemorrhage is  an etiology that is worrisome however, this may be the patient's first onset  of severe migraine headache.  Other potential etiology includes mass lesion  such as arteriovenous malformations despite a negative CT scan.   RECOMMENDATIONS:  I think we will proceed with a lumbar spinal tap.  This we  will discuss at length with the patient.  We are also going to suggest  further imaging of the brain with a head CT scan.   HISTORY:  This is a 35 year old African-American female who developed the  acute sudden onset of a severe 10/10 headache involving in the left  periorbital frontotemporal region.  She has a baseline history of what  appears to be tension-type headaches with bifrontal headaches.  These  baseline headaches are much milder and are not associated with nausea,  vomiting, photophobia or phonophobia.  This current headache was associated  with intense nausea and vomiting.  She apparently also vomited blood.  It is  believed that this was most likely due to a Mallory-Weiss tear.  The patient  apparently also has a lot of abdominal discomfort especially in the  epigastrium region and radiating laterally in both sides.  A  gastroenterological workup is in progress, but so far has been unrevealing.  The patient's last menstrual period was approximately a week and a half ago.  The current bout  of severe headache apparently did not occur in association  with her menstrual period flow stop.  The patient reports having severe  photophobia and some  phonophobia.  She also reports having some occasional  black dots, no visual obscurations are reported, however.  The patient does  not report having any tick bites or significant mosquito bites.   PAST MEDICAL HISTORY:  Essentially unremarkable.  She had an appendectomy in  2000 and also a tubal ligation in 1999.   ALLERGIES:  None known.   FAMILY HISTORY:  Significant for diabetes, heart disease.   SOCIAL HISTORY:  She is single.  She works Market researcher in a Occupational hygienist.  She has three children in good health.  No reports of alcohol  use.  She does smoke a pack of cigarettes per day and has done so for  approximately 10 years.   REVIEW OF SYSTEMS:  Significant for history  of present illness.  Otherwise  unremarkable.   ADMISSION MEDICATIONS:  None.   PHYSICAL EXAMINATION:  GENERAL:  A pleasant mildly overweight lady.  She is  in obvious discomfort from headache with patient locked into a dark room.  Her head is covered.  VITAL SIGNS:  Unremarkable including temperature.  She has been afebrile.  Current temperature is 97.1, pulse 70, respirations 20, blood pressure  97/50.  NECK:  Supple and in deed she does not complain of any neck pain.  HEAD:  Normocephalic and atraumatic.  LUNGS:  Clear to auscultation bilaterally.  CARDIOVASCULAR:  Exam revealed normal S1, S2.  ABDOMEN:  Soft.  EXTREMITIES:  Shows no edema or varicosities.  SKIN:  Normal.  NEUROLOGIC:  The patient is awake and appropriate.  She converses fluently.  There is no dysarthria, dysphasia or cognitive impairment.  Cranial nerve  evaluation shows that her pupils are 4 millimeters, briskly reactive.  Extraocular movements are intact.  The facial muscle strength is normal.  The visual fields are intact.  Funduscopic examination was limited due to   the marked light sensitivity, however disk is flat.  Tongue is midline.  Uvula is midline.  Shoulder shrug is normal.  Motor examination shows normal  tone, bulk and strength.  There is no pronator drift.  Coordination is  intact.  Reflexes are normal with toes both downgoing.  Sensory examination  -- normal to light touch and temperature.   SUPPORTIVE DATA:  A CT scan of the brain is essentially unremarkable.  There  is no acute changes seen, this was reviewed in person.  There is some  calcification seen at the superior part of the falx.  WBC 4.2, hemoglobin  11, platelet count 279,000.  The urinalysis is negative twice despite a  report of cloudy urine.  Amylase 58, lipase 28.  Potassium on initial  presentation 2.9, this has been corrected.      ___________________________________________                                            Darleen Crocker A. Gerilyn Pilgrim, M.D.   KAD/MEDQ  D:  05/14/2004  T:  05/14/2004  Job:  [id number]

## 2011-02-25 NOTE — Discharge Summary (Signed)
NAME:  [name], [name]                      ACCOUNT NO.:  [id number]   MEDICAL RECORD NO.:  [id number]                   PATIENT TYPE:  INP   LOCATION:  A332                                 FACILITY:  [facility]   PHYSICIAN:  R. Roetta Sessions, M.D.              DATE OF BIRTH:  1976-06-25   DATE OF ADMISSION:  05/12/2004  DATE OF DISCHARGE:  05/18/2004                                 DISCHARGE SUMMARY   ADMISSION DIAGNOSES:  1. Nausea and vomiting, acute onset.  2. Headache.  3. Hypokalemia.  4. Coffee ground emesis.   DISCHARGE DIAGNOSES:  1. Nausea and vomiting in the setting of severe headache, possibly due to     severe migraine per neurology.  2. Hypokalemia, corrected.  3. Coffee ground emesis with diffuse petechial hemorrhages of the gastric     mucosa of uncertain etiology.  Felt to possibly be due to trauma related     from recent retching.   PROCEDURES:  1. EGD by Dr. Jena Gauss on May 14, 2004, revealed normal-appearing esophagus,     diffuse petechial hemorrhages of the gastric mucosa.  Intense area of     hemorrhage in the area of the antral mucosa diffusely of uncertain     significance.  This was biopsies.  The remainder of the gastric mucosa     appeared normal.  This was felt to be trauma related due to recent     retching.  2. Lumbar puncture by Dr. Beryle Beams on May 14, 2004.  High opening     pressure found.  CSF Gram stain negative, culture negative at two days,     glucose was 53, protein 15, cell count x2 had too few cells to count.  3. CT scan of the head without contrast on May 12, 2004, was negative.  4. Abdominal ultrasound on May 14, 2004, was normal except for sludge     layering out in the dependent portion of the gallbladder.  5. MRI with and without contrast on May 17, 2004, was normal.   HISTORY OF PRESENT ILLNESS:  Leah Gardner is a 35 year old female who does not  have a primary care physician who was in her usual state of health when she  went to work last night on third shift.  When she was leaving work, she had  more or less sudden development of nausea, vomiting, multiple episodes of  vomiting.  She had a scant amount of coffee ground emesis.  She denied any  abdominal pain.  She also complained of photophobia and bifrontal headache.  She was evaluated by Dr. Earlyne Iba in the emergency department.  The  patient also complained that she felt like something was caught in her  throat.  Initially, she was brought over to the [facility] for possible  EGD, when she was found to have severe bifrontal headache with photophobia,  it was felt that she needed to have further workup  prior to EGD.  Dr. Karilyn Cota  wanted to rule out intracranial bleed on account of nausea and vomiting and  headache.  The patient was therefore admitted.  Evaluation in the ER,  revealed a hemoglobin of 11.3, hematocrit 33.2, MCV 84.1, WBC 5.8, glucose  101, BUN 6, creatinine 1.  Potassium 2.9, sodium 138.  LFT's were normal.  Urine pregnancy test was negative.  Urinalysis revealed 0 to 2 WBC's per  high powered field.   HOSPITAL COURSE:  Upon admission, she received an urine toxicity screen  which was positive for opiates, however, she had already doses of Dilaudid.  She was started on IV fluids with potassium chloride, Protonix IV.  She  initially had an unenhanced CT scan of the head to rule out intracranial  bleed.  This was negative.  Serum amylase and lipase was negative as well.  On day #2 of hospitalization the patient continued to complain of a  persistent headache and nausea and vomiting.  She continued to have  photophobia.  She denied any abdominal pain.  She had no further coffee  ground emesis.  She continued to complain of a lump sensation in the throat.  She was continued on symptomatic therapy.  By day #3, of hospitalization she  continued to have a headache and was complaining of some epigastric pain.  She had no nausea and  vomiting at that point.  She therefore was prepped for  an EGD for further evaluation of her symptoms.  EGD revealed diffuse  petechial hemorrhages of the gastric mucosa.  There was an intense  hemorrhage in the area of the antral mucosa diffusely of uncertain  significance.  This was biopsied.  Biopsy results pending at time of  discharge.  The remainder of the gastric mucosa appeared normal.  It was  felt that the findings may have been related to recent retching.  She was  continued to require Nubain for control of headache and anti-emetics for  nausea.  She therefore underwent a gallbladder ultrasound which was normal  except for sludge layering out in the dependent portion of the gallbladder.  Dr. Jena Gauss felt that her symptoms were not typical of biliary disease,  however.  Neurology consult was obtained.  Dr. Ila Mcgill felt subarachnoid  hemorrhage needed to be ruled out.  He performed a lumbar spinal tap which  was essentially unremarkable.  Cell count x2 unremarkable, as well as CSF  glucose and protein.  CSF Gram stain was negative, and culture was negative  at two days at time of discharge.  Cryptococcal antigen was pending.  She  had a MRI of the brain with and without contrast which was normal.  She was  placed on Pamelor preventative and Midrin for therapy for clinical picture  which we felt was most consistent with prolonged severe migraine.  At time  of discharge, the patient's headache had improved to a 4/10, initially a  10/10.  Nausea was now intermittent and no more vomiting.  She desired to be  discharged.  It was felt that she was stable for discharge and could be  followed as an outpatient.   DISCHARGE PHYSICAL EXAMINATION:  LUNGS:  Clear to auscultation bilaterally.  CARDIAC:  Regular rate and rhythm, normal S1 and S2, no murmurs, rubs, or  gallops.  ABDOMEN:  Positive bowel sounds, soft, nontender, nondistended. PSYCHIATRIC:  The patient continued to have a flat  affect as she has  throughout the hospital stay.   DISCHARGE MEDICATIONS:  1. Phenergan  25 mg one tablet q.6h. p.r.n. nausea, #20 with 0 refills.  2. Protonix 40 mg p.o. daily #30 with one refill.  3. Midrin one tablet q.2h. p.r.n. headache, no more than five per day, #10     with 0 refills.  4. Pamelor 25 mg one at bedtime, #31 with 0 refills.   FOLLOW UP:  1. She is to follow up with Dr. Gerilyn Pilgrim in two weeks.  She is aware that     she will be continuing Pamelor and Midrin as per Dr. Gerilyn Pilgrim.  A limited     supply was provided to the patient as per Dr. Jena Gauss.  2. Follow up with Dr. Jena Gauss in two weeks.   She is to notify the doctor if develop increase or persistent headache,  vomiting, or abdominal pain.   DISPOSITION:  Stable to be discharged to home.     _____________________________________  ___________________________________________  Tana Coast, P.AJonathon Bellows, M.D.   LL/MEDQ  D:  05/18/2004  T:  05/18/2004  Job:  [id number]   cc:   Kofi A. Gerilyn Pilgrim, M.D.  [address], [place]  [place]  [place] [address]  Fax: [phone]

## 2011-02-25 NOTE — Op Note (Signed)
NAME:  Leah Gardner, Leah Gardner                      ACCOUNT NO.:  [id number]   MEDICAL RECORD NO.:  [id number]                   PATIENT TYPE:  INP   LOCATION:  A332                                 FACILITY:  [facility]   PHYSICIAN:  R. Roetta Sessions, M.D.              DATE OF BIRTH:  Dec 24, 1975   DATE OF PROCEDURE:  05/14/2004  DATE OF DISCHARGE:                                 OPERATIVE REPORT   PROCEDURE:  EGD with biopsy.   INDICATIONS FOR PROCEDURE:  The patient is a 35 year old lady admitted with  acute onset of nausea and vomiting, epigastric pain, some coffee ground  emesis.  She has prominent symptoms of headache as well which is a new  symptom for this nice lady.  CT scan of the head was negative for an acute  intracranial process.  EGD is now being done to further evaluate her upper  GI tract symptoms.  This approach has been discussed with the patient at  length.  Potential risks, benefits, and alternatives have been reviewed and  questions answered.   PROCEDURE NOTE:  O2 saturation, blood pressure, pulse and respirations were  monitored throughout the entire procedure.   CONSCIOUS SEDATION:  Versed 4 mg IV, Demerol 75 mg IV in divided doses.   INSTRUMENT:  Olympus video chip system.  Cetacaine spray for topical  oropharyngeal anesthesia.   FINDINGS:  Examination of the tubular esophagus revealed no mucosal  abnormalities.  The EG junction was easily traversed.   SUMMARY:  The gastric cavity was emptied, insufflated well with air.  A  thorough examination of the gastric mucosa including retroflexed view of the  proximal stomach, esophagus, and gastric junction was undertaken.  The  patient was noted to have diffuse submucosal petechial hemorrhage.  The area  of hemorrhage was particularly intense circumferentially in the area of the  antrum.  Please see photos.  There was no frank ulcer or erosion.  The  pylorus was patent and easily traversed.  The examination of the bulb  and  second portion revealed no abnormalities.   THERAPIES/DIAGNOSTIC MANEUVERS PERFORMED:  I elected to biopsy the antral  mucosa for histologic study.  The patient tolerated the procedure well and  was reactive at endoscopy.   IMPRESSION:  1. Normal-appearing esophagus.  2. Diffuse petechial hemorrhages of the gastric mucosa.  Intense area of     hemorrhage in the area of the antral mucosa diffusely of uncertain     significance.  Biopsied.  The remainder of the gastric mucosa appeared     normal.  Normal D1, D2.  After today's findings, may in part be a trauma-     related finding from recent retching.  Certainly no evidence of any pill-     induced injury or obstruction of the tubular esophagus.   I am struck by the fact that her headache remains persistent requiring  Nubain for relief and she has never had  headaches previously.  This may all  be tied together with a viral syndrome however, headache continues to be  concerning to me.  A head CT scan reassuring but not conclusive and she does  not have a CNS process ongoing.   RECOMMENDATIONS:  1. Will follow up on pathology.  2. Check Helicobacter pylori serologies.  3. Go ahead and proceed with a gallbladder ultrasound to rule out occult     gallbladder disease contributing to her symptoms.  4. Await neurology input.  5. Further recommendations to follow.      ___________________________________________                                            Jonathon Bellows, M.D.   RMR/MEDQ  D:  05/14/2004  T:  05/14/2004  Job:  [id number]

## 2011-02-25 NOTE — Op Note (Signed)
NAME:  ELLINGTON, CORNIA                      ACCOUNT NO.:  [id number]   MEDICAL RECORD NO.:  [id number]                   PATIENT TYPE:  INP   LOCATION:  A332                                 FACILITY:  [facility]   PHYSICIAN:  Kofi A. Gerilyn Pilgrim, M.D.              DATE OF BIRTH:  03/10/1976   DATE OF PROCEDURE:  DATE OF DISCHARGE:                                  PROCEDURE NOTE   INDICATION:  This is a 35 year old lady who presents with intractable acute,  severe headaches over the past two days.  Spinal fluid is done to assess for  intracranial hemorrhage not picked up on images with CT scan, particularly  for subarachnoid hemorrhage.   PROCEDURE NOTE:  The patient was informed about the need for the procedure.  The risks and benefits were explained including 15% blood pressure post LP  headache.  The patient was prepped and draped in the usual sterile fashion.  A 20-gauge needle bevel up was passed through the L4-L5 interspace during a  single pass.  The fluid came out rapid under high pressure.  The opening  pressure measured with the legs outstretched, and the neck slightly bent,  was 27 to 28 cm of water.  The fluid was clear.  The closing pressure 8 cm  of water.  Approximately 25 cc of fluid was removed.   The patient tolerated the procedure well.  Her headache remained unchanged  before and after the procedure.      ___________________________________________                                            Darleen Crocker A. Gerilyn Pilgrim, M.D.   KAD/MEDQ  D:  05/14/2004  T:  05/15/2004  Job:  [id number]

## 2011-05-17 IMAGING — CT CT ABDOMEN W/ CM
2 of 4 series · 16 of 46 positions shown, 18 images · IV contrast (Omnipaque 300)
Comparison: CT abdomen pelvis 12/29/2008.

CT ABDOMEN

CLINICAL DATA: 2-week history of left-sided abdominal pain and
nausea/vomiting.  Surgical history includes appendectomy.  Negative
pregnancy test today.

CT ABDOMEN AND PELVIS WITH CONTRAST  09/18/2009:
TECHNIQUE: Multidetector CT imaging of the abdomen and pelvis was
performed using the standard protocol following bolus
administration of intravenous contrast.
Contrast: 100 ml Emnipaque-F22 IV.  Water utilized as oral
contrast.

[Series 2: abd_pel_with 5.0 b40f · axial · 0.88mm/px · z∈[-488,+2]mm · 13 of 108 slices shown, 15 images]
[im 5/108  soft-tissue]
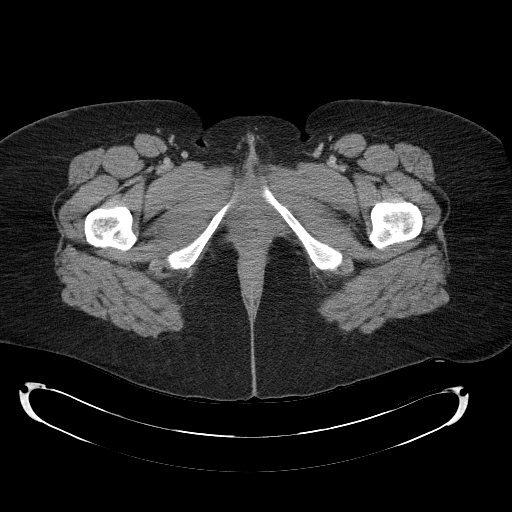
[im 5/108  bone]
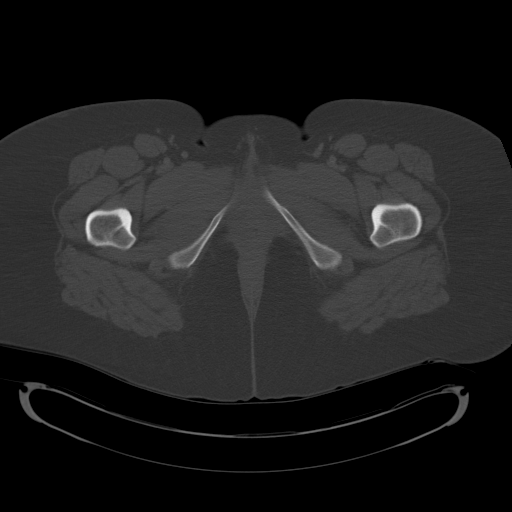
[im 14/108  soft-tissue]
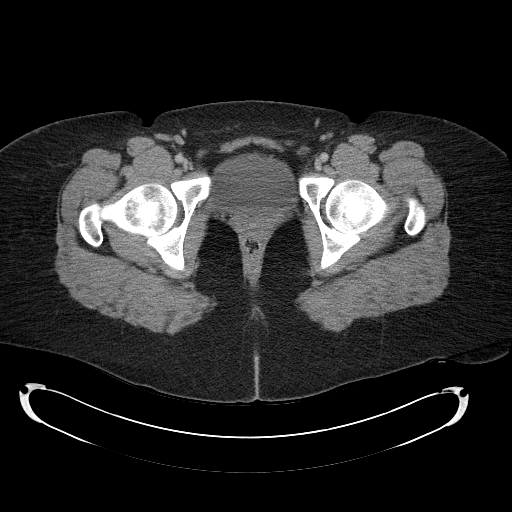
[im 24/108  soft-tissue]
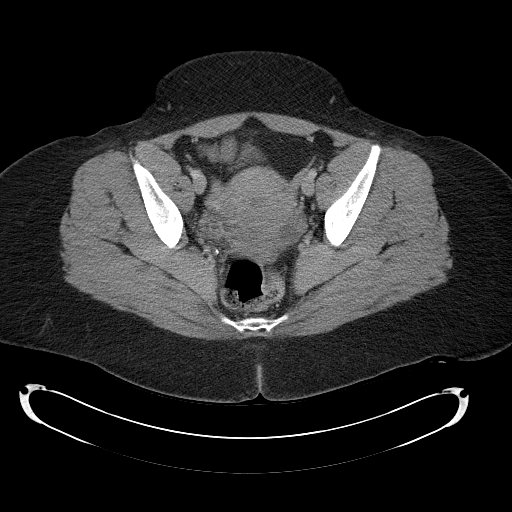
[im 28/108  soft-tissue]
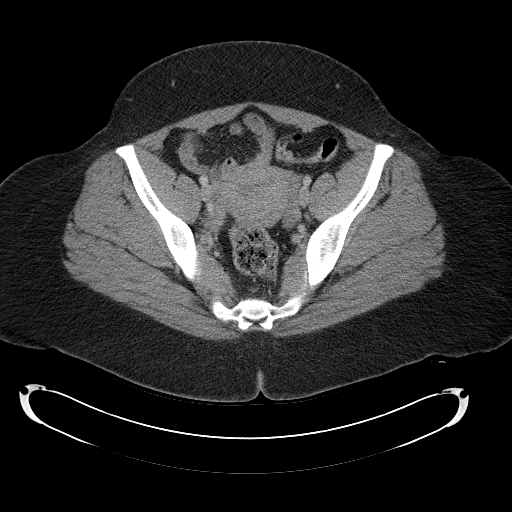
[im 38/108  soft-tissue]
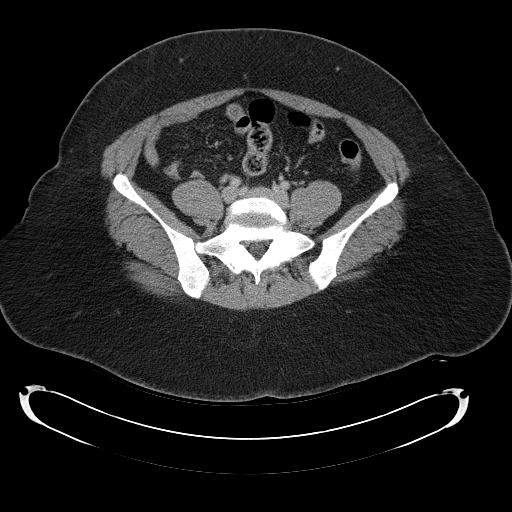
[im 47/108  soft-tissue]
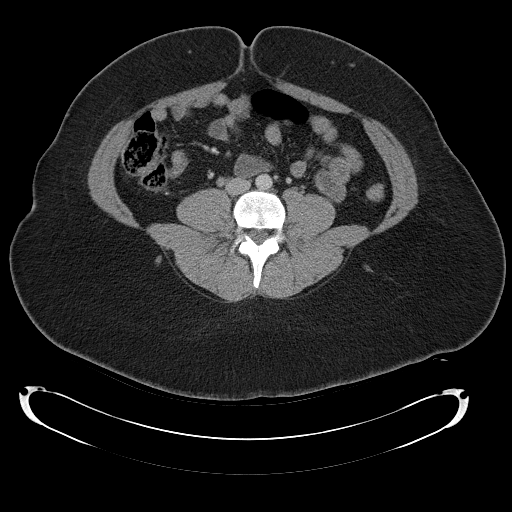
[im 56/108  soft-tissue]
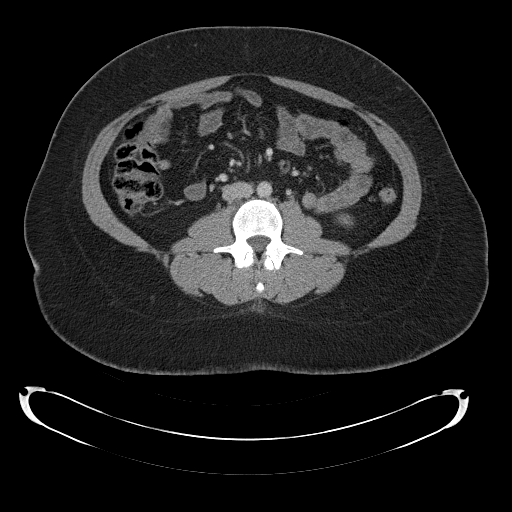
[im 61/108  soft-tissue]
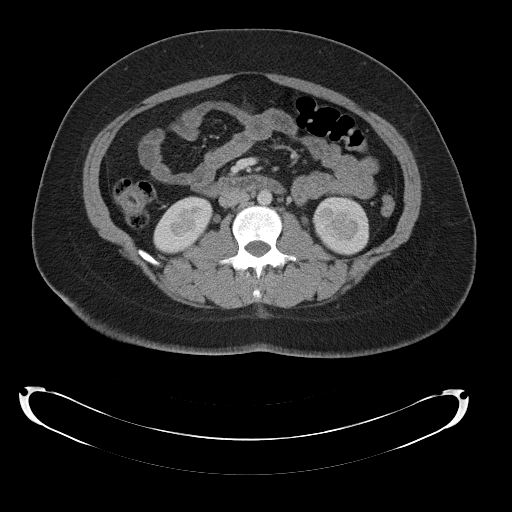
[im 70/108  soft-tissue]
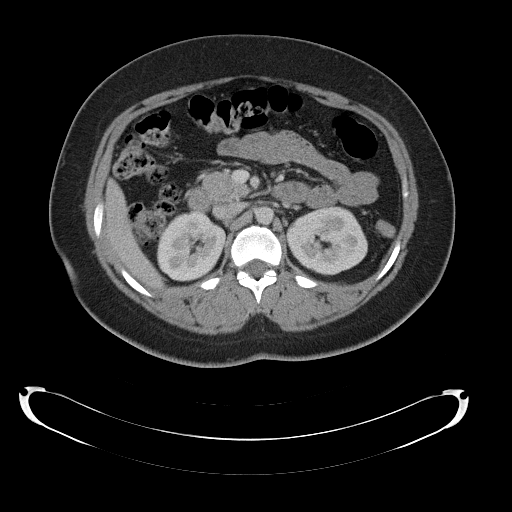
[im 70/108  bone]
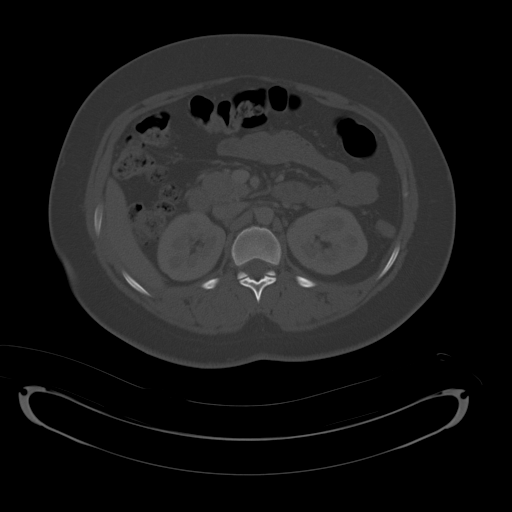
[im 80/108  soft-tissue]
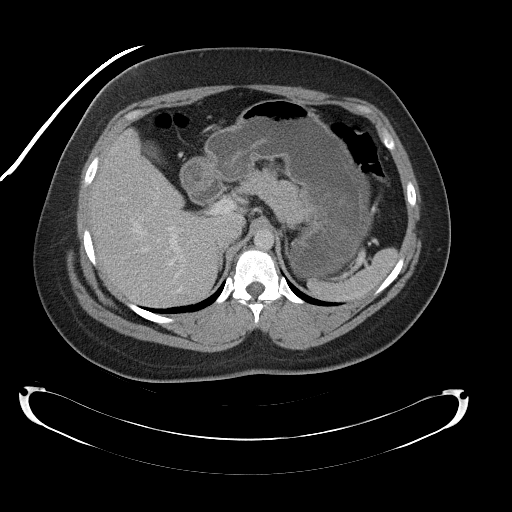
[im 84/108  soft-tissue]
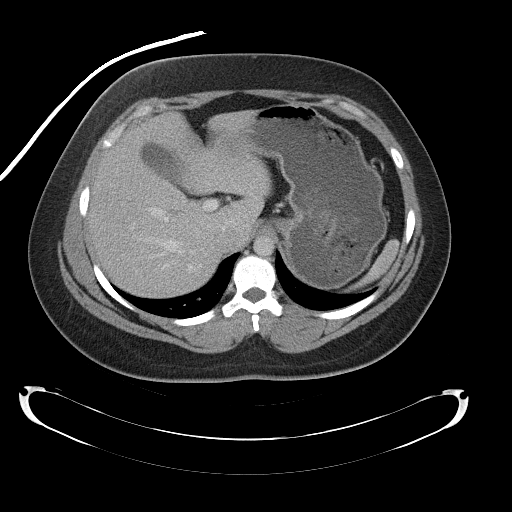
[im 94/108  soft-tissue]
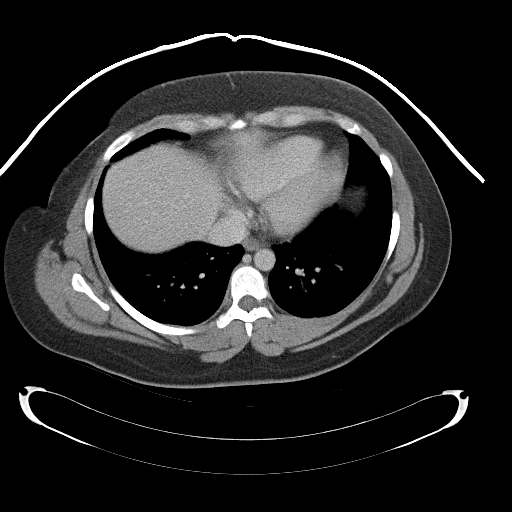
[im 103/108  soft-tissue]
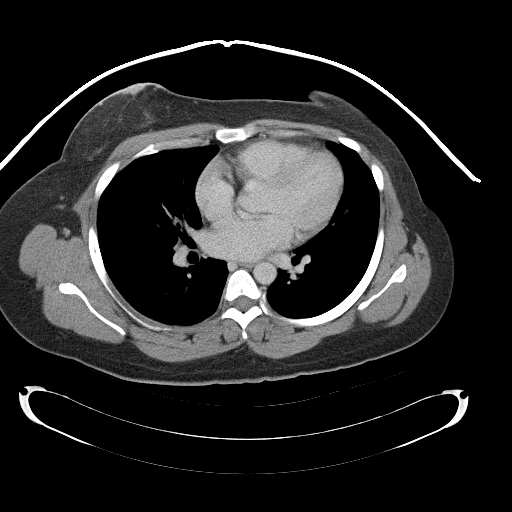

[Series 4: mpr cor post contrast (id) · coronal · 0.95mm/px · 3 of 100 slices shown]
[im 34/100  soft-tissue]
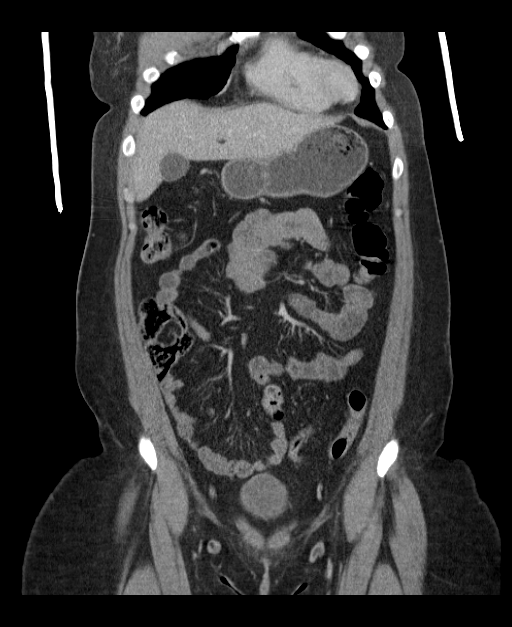
[im 45/100  soft-tissue]
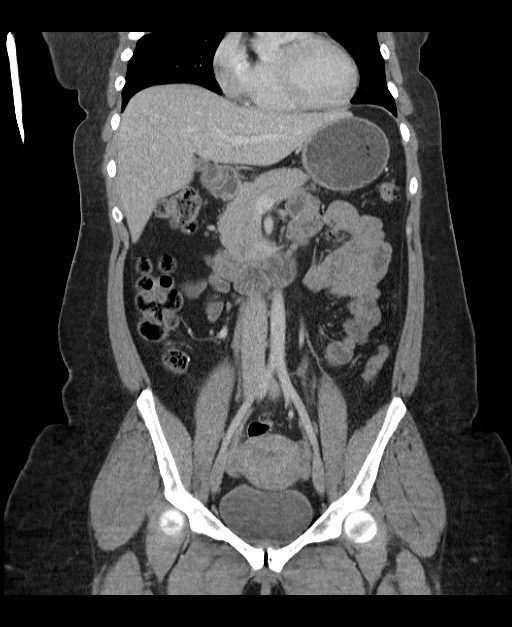
[im 56/100  soft-tissue]
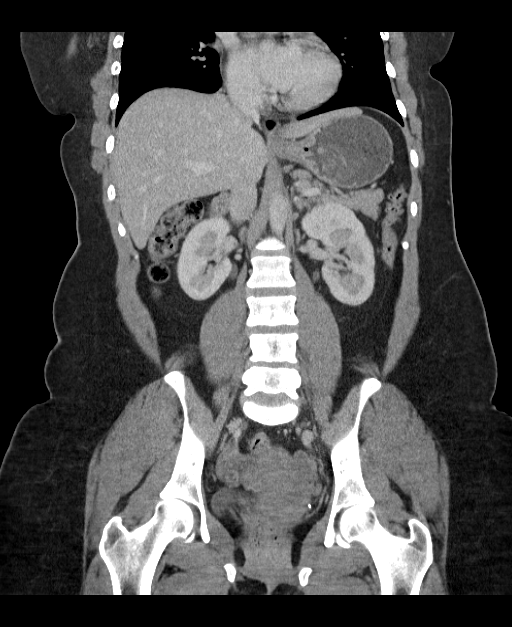

[16 of 46 positions shown; findings below may reference images not displayed]

FINDINGS: Normal appearing liver, spleen, pancreas, adrenal
glands, and kidneys.  Gallbladder unremarkable by CT.  No biliary
ductal dilation.  Stomach and visualized small bowel and colon
unremarkable.  Normal appearing abdominal aorta with widely patent
visceral arteries.  No significant lymphadenopathy.  No ascites.
Visualized lung bases clear apart from the expected dependent
atelectasis posteriorly in the lower lobes.  Bone window images
unremarkable apart from degenerative changes in the facet joints
bilaterally at L5-S1.
IMPRESSION: 1.  Normal CT of the anatomic abdomen.
2.  Bilateral facet degenerative changes at L5-S1.

CT PELVIS
FINDINGS: Uterus and ovaries unremarkable by CT.  No free pelvic
fluid.  No adnexal masses.  Small bilateral ovarian cysts
consistent with age.  Urinary bladder decompressed and
unremarkable.  Appendix surgically absent.  Visualized small bowel
and colon unremarkable.  Phleboliths in the low pelvis.  No
significant lymphadenopathy.  Bone window images demonstrate
degenerative changes in the symphysis pubis and mild degenerative
changes in the sacroiliac joints.
IMPRESSION: 1.  Normal CT of the anatomic pelvis status post appendectomy.

## 2011-07-05 LAB — DIFFERENTIAL
Basophils Relative: 0
Eosinophils Absolute: 0.1
Eosinophils Relative: 2
Monocytes Absolute: 0.5
Monocytes Relative: 10
Neutro Abs: 2.3

## 2011-07-05 LAB — CBC
HCT: 33.1 — ABNORMAL LOW
Hemoglobin: 11.5 — ABNORMAL LOW
MCHC: 34.8
MCV: 89.6
RBC: 3.69 — ABNORMAL LOW

## 2011-07-05 LAB — GC/CHLAMYDIA PROBE AMP, GENITAL
Chlamydia, DNA Probe: NEGATIVE
GC Probe Amp, Genital: NEGATIVE

## 2011-07-05 LAB — PREGNANCY, URINE: Preg Test, Ur: NEGATIVE

## 2011-07-26 LAB — DIFFERENTIAL
Basophils Absolute: 0
Eosinophils Absolute: 0.2
Eosinophils Relative: 3
Lymphocytes Relative: 39
Neutrophils Relative %: 46

## 2011-07-26 LAB — BASIC METABOLIC PANEL
BUN: 8
Creatinine, Ser: 0.89
GFR calc non Af Amer: >60
Glucose, Bld: 83
Potassium: 4.2

## 2011-07-26 LAB — CBC
HCT: 30.3 — ABNORMAL LOW
Platelets: 229
RDW: 15.1 — ABNORMAL HIGH

## 2011-08-11 ENCOUNTER — Encounter: Payer: Self-pay | Admitting: *Deleted

## 2011-08-11 ENCOUNTER — Emergency Department (HOSPITAL_COMMUNITY)
Admission: EM | Admit: 2011-08-11 | Discharge: 2011-08-11 | Disposition: A | Discharge status: Home / Self Care | Payer: Self-pay | Attending: Emergency Medicine | Admitting: Emergency Medicine

## 2011-08-11 DIAGNOSIS — R7309 Other abnormal glucose: Secondary | ICD-10-CM | POA: Insufficient documentation

## 2011-08-11 DIAGNOSIS — K219 Gastro-esophageal reflux disease without esophagitis: Secondary | ICD-10-CM | POA: Insufficient documentation

## 2011-08-11 DIAGNOSIS — F449 Dissociative and conversion disorder, unspecified: Secondary | ICD-10-CM | POA: Insufficient documentation

## 2011-08-11 DIAGNOSIS — G43909 Migraine, unspecified, not intractable, without status migrainosus: Secondary | ICD-10-CM | POA: Insufficient documentation

## 2011-08-11 HISTORY — DX: Gastro-esophageal reflux disease without esophagitis: K21.9

## 2011-08-11 LAB — BASIC METABOLIC PANEL
CO2: 26 mEq/L (ref 19–32)
Chloride: 105 mEq/L (ref 96–112)
Potassium: 3.6 mEq/L (ref 3.5–5.1)
Sodium: 140 mEq/L (ref 135–145)

## 2011-08-11 LAB — CBC
HCT: 35.7 % — ABNORMAL LOW (ref 36.0–46.0)
Platelets: 260 10*3/uL (ref 150–400)
RDW: 14.5 % (ref 11.5–15.5)
WBC: 4.8 10*3/uL (ref 4.0–10.5)

## 2011-08-11 LAB — DIFFERENTIAL
Basophils Absolute: 0 10*3/uL (ref 0.0–0.1)
Lymphocytes Relative: 35 % (ref 12–46)
Neutro Abs: 2.6 10*3/uL (ref 1.7–7.7)

## 2011-08-11 MED ORDER — DROPERIDOL 2.5 MG/ML IJ SOLN
2.5000 mg | Freq: Once | INTRAMUSCULAR | Status: AC
  Administered 2011-08-11: 2.5 mg via INTRAVENOUS
  Filled 2011-08-11: qty 2

## 2011-08-11 NOTE — ED Notes (Signed)
Family at bedside. Patient is resting and family does not need anything at this time. 

## 2011-08-11 NOTE — ED Notes (Signed)
Pt signed release of medical records. Secretary called [facility] to obtain records.

## 2011-08-11 NOTE — ED Notes (Signed)
C/o pain behind right eye x 1 week (hx of migraines-states this is typical of her migraine pain); c/o right sided numbness from right shoulder to right foot; strong right radial and right pedal pulses

## 2011-08-11 NOTE — ED Provider Notes (Signed)
Scribed for Nelia Shi, MD, the patient was seen in room APA07/APA07 . This chart was scribed by Ellie Lunch.   CSN: [id number] Arrival date & time: 08/11/2011  4:45 PM   First MD Initiated Contact with Patient 08/11/11 1647      Chief Complaint  Patient presents with  . Headache    behind right eye  . Numbness    RUE    (Consider location/radiation/quality/duration/timing/severity/associated sxs/prior treatment) HPI PT seen at 16:50.  Leah Gardner is a 35 y.o. female who presents to the Emergency Department complaining of paralysis and numbness of right side starting Monday, 3 days ago. Pt at baseline prior to Monday reports that she developed sudden onset of right sided paralysis. Sx have been constant since osnet. Nothing makes sx better or worse. Pt says she was admitted to [facility] 3 days ago on Monday and left AMA today to come down to [facility].  Pt reports no history of similar symptoms.   Additionally Pt reports she was on 2 L/min oxygen and on cath at [facility]  b/c pt feels like she cannot urinate on her own.   Past Medical History  Diagnosis Date  . Migraine   . GERD (gastroesophageal reflux disease)   borderline diabetic Heart murmur  History reviewed. No pertinent past surgical history.  No family history on file.  History  Substance Use Topics  . Smoking status: Not on file  . Smokeless tobacco: Not on file  . Alcohol Use: No    Review of Systems 10 Systems reviewed and are negative for acute change except as noted in the HPI.  Allergies  Amoxicillin; Aspirin; Codeine; Eggs or egg-derived products; Ibuprofen; Penicillins; and Sulfonamide derivatives  Home Medications   Current Outpatient Rx  Name Route Sig Dispense Refill  . ACETAMINOPHEN ER 650 MG PO TBCR Oral Take 650 mg by mouth once. While at [facility]     . [facility] 50-325-40 MG PO TABS Oral Take 1 tablet by mouth every 4 (four) hours as needed. For  pain. While at [facility]     . CLARITHROMYCIN 500 MG PO TABS Oral Take 500 mg by mouth 2 (two) times daily. While at [facility]     . ONDANSETRON HCL 4 MG PO TABS Oral Take 4 mg by mouth every 8 (eight) hours as needed. While at [facility] only     . PANTOPRAZOLE SODIUM 40 MG IV SOLR Intravenous Inject 40 mg into the vein as needed. While at [facility]     . TOPIRAMATE 50 MG PO TABS Oral Take 50 mg by mouth as needed. While at [facility]       BP 127/73  Pulse 87  Temp(Src) 98.9 F (37.2 C) (Oral)  Resp 20  Ht 5\' 8"  (1.727 m)  Wt 200 lb (90.719 kg)  BMI 30.41 kg/m2  SpO2 99%  Physical Exam  Nursing note and vitals reviewed. Constitutional: She is oriented to person, place, and time. She appears well-developed and well-nourished. No distress.  HENT:  Head: Normocephalic and atraumatic.  Eyes: EOM are normal. Pupils are equal, round, and reactive to light.  Neck: Normal range of motion.  Cardiovascular: Normal rate, regular rhythm and normal heart sounds.   Pulmonary/Chest: Effort normal and breath sounds normal.  Abdominal: Soft. There is no tenderness.  Musculoskeletal: Normal range of motion.  Neurological: She is alert and oriented to person, place, and time. No cranial nerve deficit. She displays no Babinski's sign on  the right side.       Cranial deficits 2-12 normal, no nystagmus is present. Paralysis of right arm and leg that is most likely psychosomatic. Negative face drop test. Tone nrml in right leg.    Skin: Skin is warm and dry.  Psychiatric: She has a normal mood and affect. Judgment normal.    ED Course  Procedures (including critical care time) OTHER DATA REVIEWED: Nursing notes, vital signs, and past medical records reviewed.   DIAGNOSTIC STUDIES: Oxygen Saturation is 99% on room air, normal by my interpretation.     Labs Reviewed  CBC - Abnormal; Notable for the following:    Hemoglobin 11.6 (*)    HCT 35.7 (*)     All other components within normal limits  DIFFERENTIAL  BASIC METABOLIC PANEL   ED MEDICATIONS  Medications  droperidol (INAPSINE) injection 2.5 mg (not administered)    No diagnosis found.   MDM  I personally performed the services described in this documentation, which was scribed in my presence. The recorded information has been reviewed and considered.    Patient feeling better and states she wants to go home.          Nelia Shi, MD 08/11/11 2031

## 2014-02-27 ENCOUNTER — Encounter (HOSPITAL_COMMUNITY): Payer: Self-pay | Admitting: Emergency Medicine

## 2014-02-27 ENCOUNTER — Emergency Department (HOSPITAL_COMMUNITY)
Admission: EM | Admit: 2014-02-27 | Discharge: 2014-02-27 | Discharge status: Against Medical Advice | Payer: BC Managed Care – PPO | Attending: Emergency Medicine | Admitting: Emergency Medicine

## 2014-02-27 ENCOUNTER — Emergency Department (HOSPITAL_COMMUNITY): Payer: BC Managed Care – PPO

## 2014-02-27 DIAGNOSIS — Z8719 Personal history of other diseases of the digestive system: Secondary | ICD-10-CM | POA: Insufficient documentation

## 2014-02-27 DIAGNOSIS — F17200 Nicotine dependence, unspecified, uncomplicated: Secondary | ICD-10-CM | POA: Insufficient documentation

## 2014-02-27 DIAGNOSIS — Z79899 Other long term (current) drug therapy: Secondary | ICD-10-CM | POA: Insufficient documentation

## 2014-02-27 DIAGNOSIS — Z8679 Personal history of other diseases of the circulatory system: Secondary | ICD-10-CM | POA: Insufficient documentation

## 2014-02-27 DIAGNOSIS — Z3202 Encounter for pregnancy test, result negative: Secondary | ICD-10-CM | POA: Insufficient documentation

## 2014-02-27 DIAGNOSIS — G40909 Epilepsy, unspecified, not intractable, without status epilepticus: Secondary | ICD-10-CM | POA: Insufficient documentation

## 2014-02-27 DIAGNOSIS — F209 Schizophrenia, unspecified: Secondary | ICD-10-CM | POA: Insufficient documentation

## 2014-02-27 DIAGNOSIS — G8929 Other chronic pain: Secondary | ICD-10-CM | POA: Insufficient documentation

## 2014-02-27 DIAGNOSIS — F411 Generalized anxiety disorder: Secondary | ICD-10-CM | POA: Insufficient documentation

## 2014-02-27 DIAGNOSIS — F445 Conversion disorder with seizures or convulsions: Secondary | ICD-10-CM

## 2014-02-27 DIAGNOSIS — J45909 Unspecified asthma, uncomplicated: Secondary | ICD-10-CM | POA: Insufficient documentation

## 2014-02-27 DIAGNOSIS — F319 Bipolar disorder, unspecified: Secondary | ICD-10-CM | POA: Insufficient documentation

## 2014-02-27 DIAGNOSIS — Z880 Allergy status to penicillin: Secondary | ICD-10-CM | POA: Insufficient documentation

## 2014-02-27 HISTORY — DX: Irritable bowel syndrome, unspecified: K58.9

## 2014-02-27 HISTORY — DX: Schizophrenia, unspecified: F20.9

## 2014-02-27 HISTORY — DX: Bipolar disorder, unspecified: F31.9

## 2014-02-27 HISTORY — DX: Other chronic pain: G89.29

## 2014-02-27 HISTORY — DX: Unspecified asthma, uncomplicated: J45.909

## 2014-02-27 HISTORY — DX: Dissociative and conversion disorder, unspecified: F44.9

## 2014-02-27 HISTORY — DX: Anxiety disorder, unspecified: F41.9

## 2014-02-27 HISTORY — DX: Unspecified abdominal pain: R10.9

## 2014-02-27 HISTORY — DX: Conversion disorder with seizures or convulsions: F44.5

## 2014-02-27 LAB — URINALYSIS, ROUTINE W REFLEX MICROSCOPIC
BILIRUBIN URINE: NEGATIVE
Glucose, UA: NEGATIVE mg/dL
Hgb urine dipstick: NEGATIVE
KETONES UR: NEGATIVE mg/dL
Leukocytes, UA: NEGATIVE
Nitrite: NEGATIVE
PH: 6 (ref 5.0–8.0)
Protein, ur: NEGATIVE mg/dL
Specific Gravity, Urine: <1.005 — ABNORMAL LOW (ref 1.005–1.030)
Urobilinogen, UA: 0.2 mg/dL (ref 0.0–1.0)

## 2014-02-27 LAB — PREGNANCY, URINE: Preg Test, Ur: NEGATIVE

## 2014-02-27 MED ORDER — ACETAMINOPHEN 500 MG PO TABS: 1000.0000 mg | ORAL_TABLET | Freq: Once | ORAL | Status: DC

## 2014-02-27 NOTE — ED Notes (Signed)
Patient ambulated to the restroom staff x2. Patient states her right leg hurts worse than her left. RN aware.

## 2014-02-27 NOTE — ED Provider Notes (Signed)
CSN: [id number]     Arrival date & time 02/27/14  1352 History   First MD Initiated Contact with Patient 02/27/14 1426     Chief Complaint  Patient presents with  . Seizures      HPI Pt was seen at 1430. Per EMS and pt report, c/o gradual onset and persistence of multiple intermittent episodes of "shaking" for the past 2 days. Pt states she was on her way to [place] in [place] for [place] yesterday, "but I started shaking so I went [facility]." Pt states the same "shaking" occurred today and she came to this ED. 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 Pt can talk during these episodes and does not have LOC. No fall, no AMS/confusion, no incont of bowel/bladder, no falls, no CP/palpitations, no SOB/cough, no abd pain, no N/V/D, no fevers.     Past Medical History  Diagnosis Date  . Migraine   . GERD (gastroesophageal reflux disease)   . Pseudoseizure   . Schizophrenia   . Bipolar disorder   . Asthma   . Anxiety   . Conversion disorder   . Chronic abdominal pain   . IBS (irritable bowel syndrome)    Past Surgical History  Procedure Laterality Date  . Appendectomy    . Tubal ligation      History  Substance Use Topics  . Smoking status: Current Every Day Smoker  . Smokeless tobacco: Not on file  . Alcohol Use: No     Comment: hx of heavy use    Review of Systems ROS: Statement: All systems negative except as marked or noted in the HPI; Constitutional: Negative for fever and chills. ; ; Eyes: Negative for eye pain, redness and discharge. ; ; ENMT: Negative for ear pain, hoarseness, nasal congestion, sinus pressure and sore throat. ; ; Cardiovascular: Negative for chest pain, palpitations, diaphoresis, dyspnea and peripheral edema. ; ; Respiratory: Negative for cough, wheezing and stridor. ; ; Gastrointestinal: Negative for nausea, vomiting, diarrhea, abdominal pain, blood in stool, hematemesis, jaundice and rectal  bleeding. . ; ; Genitourinary: Negative for dysuria, flank pain and hematuria. ; ; Musculoskeletal: Negative for back pain and neck pain. Negative for swelling and trauma.; ; Skin: Negative for pruritus, rash, abrasions, blisters, bruising and skin lesion.; ; Neuro: +"shaking episodes." Negative for headache, lightheadedness and neck stiffness. Negative for weakness, altered level of consciousness , altered mental status, extremity weakness, paresthesias, involuntary movement, and syncope.      Allergies  Amoxicillin; Aspirin; Codeine; Eggs or egg-derived products; Ibuprofen; Penicillins; and Sulfonamide derivatives  Home Medications   Prior to Admission medications   Medication Sig Start Date End Date Taking? Authorizing Provider  albuterol (PROVENTIL HFA;VENTOLIN HFA) 108 (90 BASE) MCG/ACT inhaler Inhale 2 puffs into the lungs 3 (three) times daily as needed for wheezing or shortness of breath.   Yes Historical Provider, MD  divalproex (DEPAKOTE ER) 500 MG 24 hr tablet Take 500 mg by mouth every 12 (twelve) hours.   Yes Historical Provider, MD  FLUoxetine (PROZAC) 40 MG capsule Take 40 mg by mouth daily.   Yes Historical Provider, MD  LORazepam (ATIVAN) 1 MG tablet Take 1 mg by mouth daily as needed for anxiety (spells or anxiety).   Yes Historical Provider, MD  Melatonin 3 MG TABS Take 3-9 mg by mouth at bedtime.   Yes Historical Provider, MD  valproic acid (DEPAKENE) 250 MG capsule Take 250 mg by mouth  3 (three) times daily.   Yes Historical Provider, MD   BP 132/99  Pulse 83  Temp(Src) 98.6 F (37 C) (Oral)  Resp 20  Ht 5\' 8"  (1.727 m)  Wt 200 lb (90.719 kg)  BMI 30.42 kg/m2  SpO2 97%  LMP 02/01/2014 Physical Exam 1435: Physical examination:  Nursing notes reviewed; Vital signs and O2 SAT reviewed;  Constitutional: Well developed, Well nourished, Well hydrated, In no acute distress; Head:  Normocephalic, atraumatic; Eyes: EOMI, PERRL, No scleral icterus; ENMT: Mouth and pharynx  normal, Mucous membranes moist; Neck: Supple, Full range of motion, No lymphadenopathy; Cardiovascular: Regular rate and rhythm, No murmur, rub, or gallop; Respiratory: Breath sounds clear & equal bilaterally, No rales, rhonchi, wheezes.  Speaking full sentences with ease, Normal respiratory effort/excursion; Chest: Nontender, Movement normal; Abdomen: Soft, Nontender, Nondistended, Normal bowel sounds; Genitourinary: No CVA tenderness; Extremities: Pulses normal, No tenderness, No edema, No calf edema or asymmetry.; Neuro: AA&Ox3, Major CN grossly intact. No facial droop. Speech clear. No gross focal motor or sensory deficits in extremities.; Skin: Color normal, Warm, Dry.; Psych:  Affect flat, poor eye contact.    ED Course  Procedures     EKG Interpretation None      MDM  MDM Reviewed: previous chart, nursing note and vitals Interpretation: labs and CT scan    Results for orders placed during the hospital encounter of 02/27/14  URINALYSIS, ROUTINE W REFLEX MICROSCOPIC      Result Value Ref Range   Color, Urine YELLOW  YELLOW   APPearance CLEAR  CLEAR   Specific Gravity, Urine <1.005 (*) 1.005 - 1.030   pH 6.0  5.0 - 8.0   Glucose, UA NEGATIVE  NEGATIVE mg/dL   Hgb urine dipstick NEGATIVE  NEGATIVE   Bilirubin Urine NEGATIVE  NEGATIVE   Ketones, ur NEGATIVE  NEGATIVE mg/dL   Protein, ur NEGATIVE  NEGATIVE mg/dL   Urobilinogen, UA 0.2  0.0 - 1.0 mg/dL   Nitrite NEGATIVE  NEGATIVE   Leukocytes, UA NEGATIVE  NEGATIVE  PREGNANCY, URINE      Result Value Ref Range   Preg Test, Ur NEGATIVE  NEGATIVE   Ct Head Wo Contrast 02/27/2014   CLINICAL DATA:  Seizure.  EXAM: CT HEAD WITHOUT CONTRAST  TECHNIQUE: Contiguous axial images were obtained from the base of the skull through the vertex without intravenous contrast.  COMPARISON:  CT scan of May 12, 2004.  FINDINGS: Bony calvarium appears intact. No mass effect or midline shift is noted. Ventricular size is within normal limits.  There is no evidence of mass lesion, hemorrhage or acute infarction.  IMPRESSION: Normal head CT.   Electronically Signed   By: Roque LiasJames  Green M.D.   On: 02/27/2014 15:45     1555:  Pt had "shaking episode" while in the ED and pt was able to talk and answer questions appropriately during it; pt told staff she was "having a pseudoseizure." Pt has been ambulatory while in the ED. Requesting something for her "pains" in her head and legs. Tylenol ordered. Pt refuses tylenol, stating she "needs something stronger or I'm leaving." No indication for narcotic pain med to tx routine headache. Pt then refused any further treatment and left the ED.   1605:  [facility] ED record from yesterday received: pt's Depakote level was therapeutic, labs otherwise unremarkable.     Laray AngerKathleen M Nakia Koble, DO 02/28/14 1552

## 2014-02-27 NOTE — ED Notes (Signed)
Pt here for "seizures". Pt noted to be shaking like she is having chills. Asked pt if she was cold she said "no" she was having a seizure.  Explained to her that she would not be talking to me if she was having a seizure. She stated they were pseudo-seizures. Pt connected to all applicable monitoring equipment.

## 2014-02-27 NOTE — ED Notes (Signed)
Per EMS, Family called EMS for seizure activity. Pt was sitting in her car and appeared to be having a "grand mal seizure" per EMS. Pt also had seizure activity yesterday, with hx of same. Pt alert and able to answer questions appropriately

## 2016-07-17 ENCOUNTER — Encounter (HOSPITAL_COMMUNITY): Payer: Self-pay | Admitting: Emergency Medicine

## 2016-07-17 ENCOUNTER — Emergency Department (HOSPITAL_COMMUNITY)
Admission: EM | Admit: 2016-07-17 | Discharge: 2016-07-18 | Disposition: A | Discharge status: Home / Self Care | Payer: BLUE CROSS/BLUE SHIELD | Attending: Emergency Medicine | Admitting: Emergency Medicine

## 2016-07-17 ENCOUNTER — Emergency Department (HOSPITAL_COMMUNITY): Payer: BLUE CROSS/BLUE SHIELD

## 2016-07-17 DIAGNOSIS — M549 Dorsalgia, unspecified: Secondary | ICD-10-CM | POA: Insufficient documentation

## 2016-07-17 DIAGNOSIS — Z79899 Other long term (current) drug therapy: Secondary | ICD-10-CM | POA: Diagnosis not present

## 2016-07-17 DIAGNOSIS — Z87891 Personal history of nicotine dependence: Secondary | ICD-10-CM | POA: Insufficient documentation

## 2016-07-17 DIAGNOSIS — I2782 Chronic pulmonary embolism: Secondary | ICD-10-CM | POA: Diagnosis not present

## 2016-07-17 DIAGNOSIS — J90 Pleural effusion, not elsewhere classified: Secondary | ICD-10-CM | POA: Diagnosis not present

## 2016-07-17 DIAGNOSIS — J45909 Unspecified asthma, uncomplicated: Secondary | ICD-10-CM | POA: Diagnosis not present

## 2016-07-17 DIAGNOSIS — R0789 Other chest pain: Secondary | ICD-10-CM | POA: Diagnosis present

## 2016-07-17 LAB — CBC
HCT: 32.6 % — ABNORMAL LOW (ref 36.0–46.0)
Hemoglobin: 10.1 g/dL — ABNORMAL LOW (ref 12.0–15.0)
MCH: 28 pg (ref 26.0–34.0)
MCHC: 31 g/dL (ref 30.0–36.0)
MCV: 90.3 fL (ref 78.0–100.0)
PLATELETS: 406 10*3/uL — AB (ref 150–400)
RBC: 3.61 MIL/uL — ABNORMAL LOW (ref 3.87–5.11)
RDW: 17.2 % — AB (ref 11.5–15.5)
WBC: 8 10*3/uL (ref 4.0–10.5)

## 2016-07-17 LAB — BASIC METABOLIC PANEL
Anion gap: 7 (ref 5–15)
BUN: 8 mg/dL (ref 6–20)
CO2: 22 mmol/L (ref 22–32)
CREATININE: 0.78 mg/dL (ref 0.44–1.00)
Calcium: 8.9 mg/dL (ref 8.9–10.3)
Chloride: 110 mmol/L (ref 101–111)
GFR calc Af Amer: >60 mL/min (ref >60)
GFR calc non Af Amer: >60 mL/min (ref >60)
Glucose, Bld: 90 mg/dL (ref 65–99)
Potassium: 3.5 mmol/L (ref 3.5–5.1)
SODIUM: 139 mmol/L (ref 135–145)

## 2016-07-17 LAB — D-DIMER, QUANTITATIVE: D-Dimer, Quant: 5.54 ug/mL-FEU — ABNORMAL HIGH (ref 0.00–0.50)

## 2016-07-17 LAB — TROPONIN I: Troponin I: <0.03 ng/mL (ref <0.03)

## 2016-07-17 MED ORDER — IOPAMIDOL (ISOVUE-370) INJECTION 76%
100.0000 mL | Freq: Once | INTRAVENOUS | Status: AC | PRN
  Administered 2016-07-17: 100 mL via INTRAVENOUS

## 2016-07-17 MED ORDER — MORPHINE SULFATE (PF) 4 MG/ML IV SOLN
4.0000 mg | Freq: Once | INTRAVENOUS | Status: AC
  Administered 2016-07-17: 4 mg via INTRAVENOUS
  Filled 2016-07-17: qty 1

## 2016-07-17 NOTE — ED Notes (Signed)
Patient transported to CT 

## 2016-07-17 NOTE — ED Triage Notes (Signed)
Patient c/o right side chest pain that radiates into right side of back. Per patient pain started last night. Patient states shortness of breath, nausea, weakness, and light headedness. Denies any cardiac hx. Patient reports taking Unisom PM for pain with no relief.

## 2016-07-17 NOTE — ED Provider Notes (Signed)
[facility]Y DEPT Provider Note   CSN: [id number] Arrival date & time: 07/17/16  1824     History   Chief Complaint Chief Complaint  Patient presents with  . Chest Pain    HPI Leah Gardner is a 40 y.o. female.  Patient complains of right-sided chest pain that radiates from her right flank and right back that has been ongoing since last night. The pain is constant. It is worse with palpation and deep breathing. Associated with shortness of breath and nausea. She denies any cough or fever. Denies any cardiac history. She states she's been taking Unisom for the pain without relief. Patient reports she has a history of a "mass" on her lung that [facility]. [facility]. Patient later states that she is currently on xarelto for history of a blood clot even though though she initially denied any history of blood clots. She states she has been poorly compliant with xarelto and takes it only when she remembers. Denies any leg pain or swelling. No birth control use.   The history is provided by the patient.  Chest Pain   Associated symptoms include back pain and shortness of breath. Pertinent negatives include no abdominal pain, no cough, no dizziness, no fever, no headaches, no nausea, no vomiting and no weakness.    Past Medical History:  Diagnosis Date  . Anxiety   . Asthma   . Bipolar disorder (HCC)   . Chronic abdominal pain   . Conversion disorder   . GERD (gastroesophageal reflux disease)   . IBS (irritable bowel syndrome)   . Migraine   . Pseudoseizure   . Schizophrenia [facility](HCC)     Patient Active Problem List   Diagnosis Date Noted  . ANEMIA 01/25/2010  . 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 BIPOLAR AFFECTIVE DISORDER 01/25/2010  . ANXIETY 01/25/2010  . ASTHMA 01/25/2010  . GERD 01/25/2010  . CONSTIPATION, CHRONIC 01/25/2010  . HEART MURMUR, BENIGN 01/25/2010  . HYPOKALEMIA 01/22/2010  . HEADACHE 01/22/2010  .  ABDOMINAL PAIN 01/22/2010    Past Surgical History:  Procedure Laterality Date  . APPENDECTOMY    . CHOLECYSTECTOMY    . TUBAL LIGATION      OB History    Gravida Para Term Preterm AB Living   4 3 3   1 3    SAB TAB Ectopic Multiple Live Births   1               Home Medications    Prior to Admission medications   Medication Sig Start Date End Date Taking? Authorizing Provider  diphenhydramine-acetaminophen (TYLENOL PM) 25-500 MG TABS tablet Take 1 tablet by mouth at bedtime as needed (for pain/rest).   Yes Historical Provider, MD  Diphenhydramine-APAP, sleep, (UNISOM PM PAIN) 50-325 MG TABS Take 1 tablet by mouth at bedtime as needed (for sleep).   Yes Historical Provider, MD  divalproex (DEPAKOTE ER) 500 MG 24 hr tablet Take 500 mg by mouth 4 (four) times daily.    Yes Historical Provider, MD  escitalopram (LEXAPRO) 20 MG tablet Take 20 mg by mouth daily.   Yes Historical Provider, MD  hydrochlorothiazide (MICROZIDE) 12.5 MG capsule Take 12.5 mg by mouth daily.   Yes Historical Provider, MD  LORazepam (ATIVAN) 1 MG tablet Take 1 mg by mouth daily as needed for anxiety (spells or anxiety).   Yes Historical Provider, MD  rivaroxaban (XARELTO) 20 MG TABS tablet Take 20 mg by mouth  daily.   Yes Historical Provider, MD    Family History Family History  Problem Relation Age of Onset  . Stroke Mother   . Heart attack Mother     Social History Social History  Substance Use Topics  . Smoking status: Former Smoker    Packs/day: 0.50    Years: 12.00    Types: Cigarettes    Quit date: 05/15/2016  . Smokeless tobacco: Never Used  . Alcohol use No     Allergies   Amoxicillin; Aspirin; Eggs or egg-derived products; Ibuprofen; Penicillins; Sulfonamide derivatives; and Tomato   Review of Systems Review of Systems  Constitutional: Negative for activity change, appetite change and fever.  HENT: Negative for congestion and rhinorrhea.   Respiratory: Positive for chest tightness  and shortness of breath. Negative for cough.   Cardiovascular: Positive for chest pain.  Gastrointestinal: Negative for abdominal pain, nausea and vomiting.  Genitourinary: Negative for dysuria, hematuria, vaginal bleeding and vaginal discharge.  Musculoskeletal: Positive for back pain. Negative for arthralgias and myalgias.  Skin: Negative for rash.  Neurological: Negative for dizziness, weakness, light-headedness and headaches.   A complete 10 system review of systems was obtained and all systems are negative except as noted in the HPI and PMH.    Physical Exam Updated Vital Signs BP 141/91   Pulse 65   Temp 98.1 F (36.7 C) (Oral)   Resp (!) 28   Ht 5\' 8"  (1.727 m)   Wt 252 lb (114.3 kg)   LMP 06/30/2016   SpO2 98%   BMI 38.32 kg/m   Physical Exam  Constitutional: She is oriented to person, place, and time. She appears well-developed and well-nourished. No distress.  HENT:  Head: Normocephalic and atraumatic.  Mouth/Throat: Oropharynx is clear and moist. No oropharyngeal exudate.  Eyes: Conjunctivae and EOM are normal. Pupils are equal, round, and reactive to light.  Neck: Normal range of motion. Neck supple.  No meningismus.  Cardiovascular: Normal rate, regular rhythm, normal heart sounds and intact distal pulses.   No murmur heard. Pulmonary/Chest: Effort normal and breath sounds normal. No respiratory distress. She exhibits tenderness.  TTP R anterior ribs and lateral ribs.  No crepitus.  Abdominal: Soft. There is no tenderness. There is no rebound and no guarding.  Musculoskeletal: Normal range of motion. She exhibits no edema or tenderness.  Neurological: She is alert and oriented to person, place, and time. No cranial nerve deficit. She exhibits normal muscle tone. Coordination normal.   5/5 strength throughout. CN 2-12 intact.Equal grip strength.   Skin: Skin is warm.  Psychiatric: She has a normal mood and affect. Her behavior is normal.  Nursing note and vitals  reviewed.    ED Treatments / Results  Labs (all labs ordered are listed, but only abnormal results are displayed) Labs Reviewed  CBC - Abnormal; Notable for the following:       Result Value   RBC 3.61 (*)    Hemoglobin 10.1 (*)    HCT 32.6 (*)    RDW 17.2 (*)    Platelets 406 (*)    All other components within normal limits  D-DIMER, QUANTITATIVE (NOT AT [facility]) - Abnormal; Notable for the following:    D-Dimer, Quant 5.54 (*)    All other components within normal limits  BASIC METABOLIC PANEL  TROPONIN I    EKG  EKG Interpretation  Date/Time:  Sunday July 17 2016 18:31:43 EDT Ventricular Rate:  79 PR Interval:  194 QRS Duration: 82 QT Interval:  400 QTC Calculation: 458 R Axis:   51 Text Interpretation:  Normal sinus rhythm Normal ECG Confirmed by Fayrene Fearing  MD, MARK ([id number]) on 07/17/2016 6:40:29 PM       Radiology Dg Chest 2 View  Result Date: 07/17/2016 CLINICAL DATA:  Right-sided chest pain, shortness of breath. History of asthma. EXAM: CHEST  2 VIEW COMPARISON:  04/21/2007 FINDINGS: Cardiomediastinal silhouette is normal. Mediastinal contours appear intact. There is no evidence of focal airspace consolidation, pleural effusion or pneumothorax. Osseous structures are without acute abnormality. Soft tissues are grossly normal. IMPRESSION: No active cardiopulmonary disease. Electronically Signed   By: Ted Mcalpine M.D.   On: 07/17/2016 19:42   Ct Angio Chest Pe W And/or Wo Contrast  Result Date: 07/17/2016 CLINICAL DATA:  Right-sided chest pain, which radiates to the right back. Shortness of breath, nausea, weakness and lightheadedness. EXAM: CT ANGIOGRAPHY CHEST WITH CONTRAST TECHNIQUE: Multidetector CT imaging of the chest was performed using the standard protocol during bolus administration of intravenous contrast. Multiplanar CT image reconstructions and MIPs were obtained to evaluate the vascular anatomy. CONTRAST:  100 cc Isovue 370 intravenously. COMPARISON:   Chest radiograph 07/17/2016 FINDINGS: Cardiovascular: Satisfactory opacification of the pulmonary arteries to the segmental level. There is a small possibly chronic pulmonary embolus within a segmental branch of the pulmonary artery to the posterior right lower lobe. No central pulmonary emboli or evidence of right heart strain. Mediastinum/Nodes: No enlarged mediastinal, hilar, or axillary lymph nodes. Thyroid gland, trachea, and esophagus demonstrate no significant findings. Lungs/Pleura: There is a moderate in size right pleural effusion. Peripheral soft tissue pulmonary nodules versus areas of airspace consolidation are seen in the basilar and lateral portion of the right lower lobe. The left lung is clear. Upper Abdomen: No acute abnormality.  Postcholecystectomy P Musculoskeletal: No chest wall abnormality. No acute or significant osseous findings. Review of the MIP images confirms the above findings. IMPRESSION: Small nonocclusive, possibly chronic, pulmonary embolus within a segmental branch of the right lower lobe pulmonary artery. Moderate in size right pleural effusion. Peripheral soft tissue pulmonary nodules versus areas of airspace consolidation in the basilar and lateral portion of the right lower lobe. These may represent areas of hypoperfusion/hypoventilation, consolidation or potentially true pulmonary masses. Follow-up after resolution of patient's acute clinical symptoms is recommended to assure resolution or decrease in size of these nodules. Electronically Signed   By: Ted Mcalpine M.D.   On: 07/17/2016 22:14    Procedures Procedures (including critical care time)  Medications Ordered in ED Medications  iopamidol (ISOVUE-370) 76 % injection 100 mL (100 mLs Intravenous Contrast Given 07/17/16 2127)  morphine 4 MG/ML injection 4 mg (4 mg Intravenous Given 07/17/16 2157)     Initial Impression / Assessment and Plan / ED Course  I have reviewed the triage vital signs and the  nursing notes.  Pertinent labs & imaging results that were available during my care of the patient were reviewed by me and considered in my medical decision making (see chart for details).  Clinical Course  Reproducible right-sided chest pain without rash. Questionable history of lung mass with questionable history of blood clots. Patient initially not forthcoming with this information.  Patient states she was diagnosed with pulmonary embolism in June in [place]. States she has been on xarelto since then. Was initially twice a day but then once a day. States she's been taking it regularly other than the past 2 days she "forgot to take it".  Right-sided chest pain appears to be  musculoskeletal. CT however shows small pulmonary embolism in the segmental branch of the right lower lobe with a moderate-sized pleural effusion. No R heart strain.  D/w Dr. Mosetta Putt of hematology. She recommends starting Lovenox injections if patient is willing (no coumadin transition), otherwise restart loading dose of xarelto. Hard to consider this xarelto failure when patient has been less than compliant.  Patient elects lovenox, thinks that it will be easier for her to remember. D/w Clydie Braun PharmD.  Ok to start lovenox now, last xarelto dose >48 hours ago.  Patient understands she will use lovenox 1 mg /kg BID instead of xarelto. Continue lovenox with no planned transition to coumadin per Dr. Mosetta Putt.  Needs to see PCP to continue prescription.She is not hypoxic or tachycardic.   Uncertain etiology of patient's pleural effusion and lung nodules (cound be areas of infarct or infiltrate). D/w Dr. Antionette Char who recommends treating with antibiotics and followup. He agrees that patient seems stable for outpatient treatment.  Patient is in no distress, on phone through majority of ED visit with no hypoxia or tachycardia. She states she will comply with lovenox. Has followup with [facility] pulmonology 10/25.  Return precautions  discussed.  BP 129/81   Pulse 70   Temp 98.1 F (36.7 C) (Oral)   Resp 19   Ht 5\' 8"  (1.727 m)   Wt 252 lb (114.3 kg)   LMP 06/30/2016   SpO2 96%   BMI 38.32 kg/m    Final Clinical Impressions(s) / ED Diagnoses   Final diagnoses:  Other chronic pulmonary embolism without acute cor pulmonale (HCC)  Pleural effusion    New Prescriptions Discharge Medication List as of 07/18/2016 12:49 AM    START taking these medications   Details  enoxaparin (LOVENOX) 120 MG/0.8ML injection Inject 0.77 mLs (115 mg total) into the skin every 12 (twelve) hours., Starting Mon 07/18/2016, Print    levofloxacin (LEVAQUIN) 500 MG tablet Take 1 tablet (500 mg total) by mouth daily., Starting Mon 07/18/2016, Print         Glynn Octave, MD 07/18/16 [phone]

## 2016-07-17 NOTE — ED Notes (Signed)
Physician in to discuss and reassess

## 2016-07-17 NOTE — ED Notes (Signed)
In radiology

## 2016-07-17 NOTE — ED Notes (Signed)
Pt refused to ambulate. She states that she will get short of breath if she gets up.

## 2016-07-18 MED ORDER — ENOXAPARIN SODIUM 120 MG/0.8ML ~~LOC~~ SOLN: 115.0000 mg | Freq: Two times a day (BID) | SUBCUTANEOUS | 0 refills | Status: DC

## 2016-07-18 MED ORDER — LEVOFLOXACIN 500 MG PO TABS: 500.0000 mg | ORAL_TABLET | Freq: Every day | ORAL | 0 refills | Status: DC

## 2016-07-18 MED ORDER — ENOXAPARIN SODIUM 120 MG/0.8ML ~~LOC~~ SOLN
1.0000 mg/kg | Freq: Once | SUBCUTANEOUS | Status: AC
  Administered 2016-07-18: 115 mg via SUBCUTANEOUS
  Filled 2016-07-18: qty 0.8

## 2016-07-18 NOTE — ED Notes (Signed)
Pt has refused x 2 to ambulate

## 2016-07-18 NOTE — Discharge Instructions (Signed)
Take the lovenox instead of the xarelto., Follow up with your doctor for refiils. Return to the ED if you develop worsening chest pain, shortness of breath or any other concerns.

## 2017-08-30 ENCOUNTER — Telehealth: Payer: Self-pay | Admitting: Emergency Medicine

## 2017-09-03 NOTE — Telephone Encounter (Signed)
I think this note was created in error. I do not know this patient

## 2019-04-15 ENCOUNTER — Encounter (HOSPITAL_COMMUNITY): Payer: Self-pay | Admitting: Emergency Medicine

## 2019-04-15 ENCOUNTER — Emergency Department (HOSPITAL_COMMUNITY): Payer: BC Managed Care – PPO

## 2019-04-15 ENCOUNTER — Other Ambulatory Visit: Payer: Self-pay

## 2019-04-15 ENCOUNTER — Emergency Department (HOSPITAL_COMMUNITY)
Admission: EM | Admit: 2019-04-15 | Discharge: 2019-04-15 | Disposition: A | Discharge status: Home / Self Care | Payer: BC Managed Care – PPO | Attending: Emergency Medicine | Admitting: Emergency Medicine

## 2019-04-15 DIAGNOSIS — Z20828 Contact with and (suspected) exposure to other viral communicable diseases: Secondary | ICD-10-CM | POA: Insufficient documentation

## 2019-04-15 DIAGNOSIS — Z87891 Personal history of nicotine dependence: Secondary | ICD-10-CM | POA: Diagnosis not present

## 2019-04-15 DIAGNOSIS — R0789 Other chest pain: Secondary | ICD-10-CM | POA: Diagnosis present

## 2019-04-15 DIAGNOSIS — Z79899 Other long term (current) drug therapy: Secondary | ICD-10-CM | POA: Diagnosis not present

## 2019-04-15 DIAGNOSIS — J45909 Unspecified asthma, uncomplicated: Secondary | ICD-10-CM | POA: Insufficient documentation

## 2019-04-15 LAB — CBC WITH DIFFERENTIAL/PLATELET
Abs Immature Granulocytes: 0.01 10*3/uL (ref 0.00–0.07)
Basophils Absolute: 0 10*3/uL (ref 0.0–0.1)
Basophils Relative: 0 %
Eosinophils Absolute: 0.1 10*3/uL (ref 0.0–0.5)
Eosinophils Relative: 2 %
HCT: 35 % — ABNORMAL LOW (ref 36.0–46.0)
Hemoglobin: 11.1 g/dL — ABNORMAL LOW (ref 12.0–15.0)
Immature Granulocytes: 0 %
Lymphocytes Relative: 56 %
Lymphs Abs: 3.3 10*3/uL (ref 0.7–4.0)
MCH: 30.1 pg (ref 26.0–34.0)
MCHC: 31.7 g/dL (ref 30.0–36.0)
MCV: 94.9 fL (ref 80.0–100.0)
Monocytes Absolute: 0.5 10*3/uL (ref 0.1–1.0)
Monocytes Relative: 8 %
Neutro Abs: 2 10*3/uL (ref 1.7–7.7)
Neutrophils Relative %: 34 %
Platelets: 270 10*3/uL (ref 150–400)
RBC: 3.69 MIL/uL — ABNORMAL LOW (ref 3.87–5.11)
RDW: 14.8 % (ref 11.5–15.5)
WBC: 5.9 10*3/uL (ref 4.0–10.5)
nRBC: 0 % (ref 0.0–0.2)

## 2019-04-15 LAB — BASIC METABOLIC PANEL
Anion gap: 11 (ref 5–15)
BUN: 11 mg/dL (ref 6–20)
CO2: 23 mmol/L (ref 22–32)
Calcium: 8.9 mg/dL (ref 8.9–10.3)
Chloride: 105 mmol/L (ref 98–111)
Creatinine, Ser: 0.9 mg/dL (ref 0.44–1.00)
GFR calc Af Amer: >60 mL/min (ref >60)
GFR calc non Af Amer: >60 mL/min (ref >60)
Glucose, Bld: 104 mg/dL — ABNORMAL HIGH (ref 70–99)
Potassium: 3.3 mmol/L — ABNORMAL LOW (ref 3.5–5.1)
Sodium: 139 mmol/L (ref 135–145)

## 2019-04-15 LAB — I-STAT BETA HCG BLOOD, ED (MC, WL, AP ONLY): I-stat hCG, quantitative: <5 m[IU]/mL (ref <5)

## 2019-04-15 LAB — TROPONIN I (HIGH SENSITIVITY)
Troponin I (High Sensitivity): 2 ng/L (ref <18)
Troponin I (High Sensitivity): 3 ng/L (ref <18)

## 2019-04-15 MED ORDER — IOHEXOL 350 MG/ML SOLN
100.0000 mL | Freq: Once | INTRAVENOUS | Status: AC | PRN
  Administered 2019-04-15: 100 mL via INTRAVENOUS

## 2019-04-15 MED ORDER — METHOCARBAMOL 500 MG PO TABS: 1000.0000 mg | ORAL_TABLET | Freq: Four times a day (QID) | ORAL | 0 refills | Status: DC | PRN

## 2019-04-15 MED ORDER — POTASSIUM CHLORIDE CRYS ER 20 MEQ PO TBCR
40.0000 meq | EXTENDED_RELEASE_TABLET | Freq: Once | ORAL | Status: AC
  Administered 2019-04-15: 40 meq via ORAL
  Filled 2019-04-15: qty 2

## 2019-04-15 MED ORDER — HYDROCODONE-ACETAMINOPHEN 5-325 MG PO TABS: ORAL_TABLET | ORAL | 0 refills | Status: AC

## 2019-04-15 MED ORDER — MORPHINE SULFATE (PF) 4 MG/ML IV SOLN
4.0000 mg | INTRAVENOUS | Status: DC | PRN
  Administered 2019-04-15: 4 mg via INTRAVENOUS
  Filled 2019-04-15: qty 1

## 2019-04-15 NOTE — ED Triage Notes (Signed)
Per EMS patient from home. Patient complains of chest with shortness of breath that started x 3 days. States history of travel to [place] last Friday. States chills and loss of apatite.   Also states history of PE. Takes Eloquis.

## 2019-04-15 NOTE — Discharge Instructions (Signed)
Person Under Monitoring Name: Leah Gardner  Location: [address] [place] [id number]   Infection Prevention Recommendations for Individuals Confirmed to have, or Being Evaluated for, 2019 Novel Coronavirus (COVID-19) Infection Who Receive Care at Home  Individuals who are confirmed to have, or are being evaluated for, COVID-19 should follow the prevention steps below until a healthcare provider or local or state health department says they can return to normal activities.  Stay home except to get medical care You should restrict activities outside your home, except for getting medical care. Do not go to work, school, or public areas, and do not use public transportation or taxis.  Call ahead before visiting your doctor Before your medical appointment, call the healthcare provider and tell them that you have, or are being evaluated for, COVID-19 infection. This will help the healthcare providers office take steps to keep other people from getting infected. Ask your healthcare provider to call the local or state health department.  Monitor your symptoms Seek prompt medical attention if your illness is worsening (e.g., difficulty breathing). Before going to your medical appointment, call the healthcare provider and tell them that you have, or are being evaluated for, COVID-19 infection. Ask your healthcare provider to call the local or state health department.  Wear a facemask You should wear a facemask that covers your nose and mouth when you are in the same room with other people and when you visit a healthcare provider. People who live with or visit you should also wear a facemask while they are in the same room with you.  Separate yourself from other people in your home As much as possible, you should stay in a different room from other people in your home. Also, you should use a separate bathroom, if available.  Avoid sharing household items You should not  share dishes, drinking glasses, cups, eating utensils, towels, bedding, or other items with other people in your home. After using these items, you should wash them thoroughly with soap and water.  Cover your coughs and sneezes Cover your mouth and nose with a tissue when you cough or sneeze, or you can cough or sneeze into your sleeve. Throw used tissues in a lined trash can, and immediately wash your hands with soap and water for at least 20 seconds or use an alcohol-based hand rub.  Wash your [employer] Wash your hands often and thoroughly with soap and water for at least 20 seconds. You can use an alcohol-based hand sanitizer if soap and water are not available and if your hands are not visibly dirty. Avoid touching your eyes, nose, and mouth with unwashed hands.   Prevention Steps for Caregivers and Household Members of Individuals Confirmed to have, or Being Evaluated for, COVID-19 Infection Being Cared for in the Home  If you live with, or provide care at home for, a person confirmed to have, or being evaluated for, COVID-19 infection please follow these guidelines to prevent infection:  Follow healthcare providers instructions Make sure that you understand and can help the patient follow any healthcare provider instructions for all care.  Provide for the patients basic needs You should help the patient with basic needs in the home and provide support for getting groceries, prescriptions, and other personal needs.  Monitor the patients symptoms If they are getting sicker, call his or her medical provider and tell them that the patient has, or is being evaluated for, COVID-19 infection. This will help the healthcare  providers office take steps to keep other people from getting infected. Ask the healthcare provider to call the local or state health department.  Limit the number of people who have contact with the patient If possible, have only one caregiver for the  patient. Other household members should stay in another home or place of residence. If this is not possible, they should stay in another room, or be separated from the patient as much as possible. Use a separate bathroom, if available. Restrict visitors who do not have an essential need to be in the home.  Keep older adults, very young children, and other sick people away from the patient Keep older adults, very young children, and those who have compromised immune systems or chronic health conditions away from the patient. This includes people with chronic heart, lung, or kidney conditions, diabetes, and cancer.  Ensure good ventilation Make sure that shared spaces in the home have good air flow, such as from an air conditioner or an opened window, weather permitting.  Wash your hands often Wash your hands often and thoroughly with soap and water for at least 20 seconds. You can use an alcohol based hand sanitizer if soap and water are not available and if your hands are not visibly dirty. Avoid touching your eyes, nose, and mouth with unwashed hands. Use disposable paper towels to dry your hands. If not available, use dedicated cloth towels and replace them when they become wet.  Wear a facemask and gloves Wear a disposable facemask at all times in the room and gloves when you touch or have contact with the patients blood, body fluids, and/or secretions or excretions, such as sweat, saliva, sputum, nasal mucus, vomit, urine, or feces.  Ensure the mask fits over your nose and mouth tightly, and do not touch it during use. Throw out disposable facemasks and gloves after using them. Do not reuse. Wash your hands immediately after removing your facemask and gloves. If your personal clothing becomes contaminated, carefully remove clothing and launder. Wash your hands after handling contaminated clothing. Place all used disposable facemasks, gloves, and other waste in a lined container before  disposing them with other household waste. Remove gloves and wash your hands immediately after handling these items.  Do not share dishes, glasses, or other household items with the patient Avoid sharing household items. You should not share dishes, drinking glasses, cups, eating utensils, towels, bedding, or other items with a patient who is confirmed to have, or being evaluated for, COVID-19 infection. After the person uses these items, you should wash them thoroughly with soap and water.  Wash laundry thoroughly Immediately remove and wash clothes or bedding that have blood, body fluids, and/or secretions or excretions, such as sweat, saliva, sputum, nasal mucus, vomit, urine, or feces, on them. Wear gloves when handling laundry from the patient. Read and follow directions on labels of laundry or clothing items and detergent. In general, wash and dry with the warmest temperatures recommended on the label.  Clean all areas the individual has used often Clean all touchable surfaces, such as counters, tabletops, doorknobs, bathroom fixtures, toilets, phones, keyboards, tablets, and bedside tables, every day. Also, clean any surfaces that may have blood, body fluids, and/or secretions or excretions on them. Wear gloves when cleaning surfaces the patient has come in contact with. Use a diluted bleach solution (e.g., dilute bleach with 1 part bleach and 10 parts water) or a household disinfectant with a label that says EPA-registered for coronaviruses. To make  a bleach solution at home, add 1 tablespoon of bleach to 1 quart (4 cups) of water. For a larger supply, add  cup of bleach to 1 gallon (16 cups) of water. Read labels of cleaning products and follow recommendations provided on product labels. Labels contain instructions for safe and effective use of the cleaning product including precautions you should take when applying the product, such as wearing gloves or eye protection and making sure you  have good ventilation during use of the product. Remove gloves and wash hands immediately after cleaning.  Monitor yourself for signs and symptoms of illness Caregivers and household members are considered close contacts, should monitor their health, and will be asked to limit movement outside of the home to the extent possible. Follow the monitoring steps for close contacts listed on the symptom monitoring form.   ? If you have additional questions, contact your local health department or call the epidemiologist on call at [phone] (available 24/7). ? This guidance is subject to change. For the most up-to-date guidance from [facility], please refer to their website: YouBlogs.pl   Take the prescriptions as directed.  Apply moist heat or ice to the area(s) of discomfort, for 15 minutes at a time, several times per day for the next few days.  Do not fall asleep on a heating or ice pack.  Call your regular medical doctor today to schedule a follow up appointment this week.  Return to the Emergency Department immediately if worsening.

## 2019-04-15 NOTE — ED Provider Notes (Signed)
[facility] EMERGENCY DEPARTMENT Provider Note   CSN: [id number] Arrival date & time: 04/15/19  [id number]     History   Chief Complaint Chief Complaint  Patient presents with  . Chest Pain    HPI Evorn GongSherry R Ledwith is a 43 y.o. female.     HPI  Pt was seen at 1030. Per EMS and pt, c/o gradual onset and worsening of constant mid-sternal chest "pains" for the past 4 days. Describes the CP as "sore," with radiation around her left chest wall into her back. Has been associated with mild cough and SOB. Pain worsens with deep breath, movement, and palpation of the area. Pt endorses recent long car travel from [place] to [place] and back. Pt states she has been taking her Eliquis (for hx PE) as prescribed. Denies fevers, no rash, no injury, no palpitations, no abd pain, no N/V/D.     Past Medical History:  Diagnosis Date  . Anxiety   . Asthma   . Bipolar disorder (HCC)   . Chronic abdominal pain   . Conversion disorder   . GERD (gastroesophageal reflux disease)   . IBS (irritable bowel syndrome)   . Migraine   . Pseudoseizure   . Schizophrenia [facility](HCC)     Patient Active Problem List   Diagnosis Date Noted  . ANEMIA 01/25/2010  . 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 BIPOLAR AFFECTIVE DISORDER 01/25/2010  . ANXIETY 01/25/2010  . ASTHMA 01/25/2010  . GERD 01/25/2010  . CONSTIPATION, CHRONIC 01/25/2010  . HEART MURMUR, BENIGN 01/25/2010  . HYPOKALEMIA 01/22/2010  . HEADACHE 01/22/2010  . ABDOMINAL PAIN 01/22/2010    Past Surgical History:  Procedure Laterality Date  . APPENDECTOMY    . CHOLECYSTECTOMY    . TUBAL LIGATION       OB History    Gravida  4   Para  3   Term  3   Preterm      AB  1   Living  3     SAB  1   TAB      Ectopic      Multiple      Live Births               Home Medications    Prior to Admission medications   Medication Sig Start Date End Date Taking? Authorizing Provider  diphenhydramine-acetaminophen (TYLENOL PM) 25-500  MG TABS tablet Take 1 tablet by mouth at bedtime as needed (for pain/rest).    [provider]  Diphenhydramine-APAP, sleep, (UNISOM PM PAIN) 50-325 MG TABS Take 1 tablet by mouth at bedtime as needed (for sleep).    [provider]  divalproex (DEPAKOTE ER) 500 MG 24 hr tablet Take 500 mg by mouth 4 (four) times daily.     [provider]  enoxaparin (LOVENOX) 120 MG/0.8ML injection Inject 0.77 mLs (115 mg total) into the skin every 12 (twelve) hours. 07/18/16   Rancour, Jeannett SeniorStephen, MD  escitalopram (LEXAPRO) 20 MG tablet Take 20 mg by mouth daily.    [provider]  hydrochlorothiazide (MICROZIDE) 12.5 MG capsule Take 12.5 mg by mouth daily.    [provider]  levofloxacin (LEVAQUIN) 500 MG tablet Take 1 tablet (500 mg total) by mouth daily. 07/18/16   Rancour, Jeannett SeniorStephen, MD  LORazepam (ATIVAN) 1 MG tablet Take 1 mg by mouth daily as needed for anxiety (spells or anxiety).    [provider]    Family History Family History  Problem Relation Age of Onset  .  Stroke Mother   . Heart attack Mother     Social History Social History   Tobacco Use  . Smoking status: Former Smoker    Packs/day: 0.50    Years: 12.00    Pack years: 6.00    Types: Cigarettes    Quit date: 05/15/2016    Years since quitting: 2.9  . Smokeless tobacco: Never Used  Substance Use Topics  . Alcohol use: No  . Drug use: No    Comment: hx of crack/cocain use     Allergies   Amoxicillin, Aspirin, Eggs or egg-derived products, Ibuprofen, Penicillins, Sulfonamide derivatives, and Tomato   Review of Systems Review of Systems ROS: Statement: All systems negative except as marked or noted in the HPI; Constitutional: Negative for fever and chills. ; ; Eyes: Negative for eye pain, redness and discharge. ; ; ENMT: Negative for ear pain, hoarseness, nasal congestion, sinus pressure and sore throat. ; ; Cardiovascular: +CP, SOB. Negative for palpitations, diaphoresis, and  peripheral edema. ; ; Respiratory: Negative for cough, wheezing and stridor. ; ; Gastrointestinal: Negative for nausea, vomiting, diarrhea, abdominal pain, blood in stool, hematemesis, jaundice and rectal bleeding. . ; ; Genitourinary: Negative for dysuria, flank pain and hematuria. ; ; Musculoskeletal: Negative for back pain and neck pain. Negative for swelling and trauma.; ; Skin: Negative for pruritus, rash, abrasions, blisters, bruising and skin lesion.; ; Neuro: Negative for headache, lightheadedness and neck stiffness. Negative for weakness, altered level of consciousness, altered mental status, extremity weakness, paresthesias, involuntary movement, seizure and syncope.       Physical Exam Updated Vital Signs BP 138/78   Pulse 62   Temp 98 F (36.7 C) (Oral)   Resp 18   Ht 5\' 8"  (1.727 m)   Wt 114 kg   SpO2 99%   BMI 38.21 kg/m   Physical Exam 1035: Physical examination:  Nursing notes reviewed; Vital signs and O2 SAT reviewed;  Constitutional: Well developed, Well nourished, Well hydrated, In no acute distress; Head:  Normocephalic, atraumatic; Eyes: EOMI, PERRL, No scleral icterus; ENMT: Mouth and pharynx normal, Mucous membranes moist; Neck: Supple, Full range of motion, No lymphadenopathy; Cardiovascular: Regular rate and rhythm, No gallop; Respiratory: Breath sounds clear & equal bilaterally, No wheezes.  Speaking full sentences with ease, Normal respiratory effort/excursion; Chest: +left parasternal and anterior chest wall areas tender to palp. No rash, no soft tissue crepitus. Movement normal; Abdomen: Soft, Nontender, Nondistended, Normal bowel sounds; Genitourinary: No CVA tenderness; Extremities: Peripheral pulses normal, No tenderness, No edema, No calf edema or asymmetry.; Neuro: AA&Ox3, Major CN grossly intact.  Speech clear. No gross focal motor or sensory deficits in extremities.; Skin: Color normal, Warm, Dry.   ED Treatments / Results  Labs (all labs ordered are  listed, but only abnormal results are displayed)   EKG EKG Interpretation  Date/Time:  Monday April 15 2019 10:18:35 EDT Ventricular Rate:  61 PR Interval:    QRS Duration: 93 QT Interval:  434 QTC Calculation: 438 R Axis:   41 Text Interpretation:  Sinus rhythm Prolonged PR interval Baseline wander Artifact When compared with ECG of 07/17/2016 No significant change was found Confirmed by Samuel JesterMcManus, Hamdan Toscano [phone]([id number]) on 04/15/2019 10:39:42 AM   Radiology   Procedures Procedures (including critical care time)  Medications Ordered in ED Medications  morphine 4 MG/ML injection 4 mg (has no administration in time range)     Initial Impression / Assessment and Plan / ED Course  I have reviewed the triage vital signs  and the nursing notes.  Pertinent labs & imaging results that were available during my care of the patient were reviewed by me and considered in my medical decision making (see chart for details).    MDM Reviewed: previous chart, nursing note and vitals Reviewed previous: labs and ECG Interpretation: labs, ECG and CT scan   Results for orders placed or performed during the hospital encounter of 54/27/06  Basic metabolic panel  Result Value Ref Range   Sodium 139 135 - 145 mmol/L   Potassium 3.3 (L) 3.5 - 5.1 mmol/L   Chloride 105 98 - 111 mmol/L   CO2 23 22 - 32 mmol/L   Glucose, Bld 104 (H) 70 - 99 mg/dL   BUN 11 6 - 20 mg/dL   Creatinine, Ser 0.90 0.44 - 1.00 mg/dL   Calcium 8.9 8.9 - 10.3 mg/dL   GFR calc non Af Amer >60 >60 mL/min   GFR calc Af Amer >60 >60 mL/min   Anion gap 11 5 - 15  Troponin I (High Sensitivity)  Result Value Ref Range   Troponin I (High Sensitivity) 2.00 <18 ng/L  Troponin I (High Sensitivity)  Result Value Ref Range   Troponin I (High Sensitivity) 3.00 <18 ng/L  CBC with Differential  Result Value Ref Range   WBC 5.9 4.0 - 10.5 K/uL   RBC 3.69 (L) 3.87 - 5.11 MIL/uL   Hemoglobin 11.1 (L) 12.0 - 15.0 g/dL   HCT 35.0 (L) 36.0 -  46.0 %   MCV 94.9 80.0 - 100.0 fL   MCH 30.1 26.0 - 34.0 pg   MCHC 31.7 30.0 - 36.0 g/dL   RDW 14.8 11.5 - 15.5 %   Platelets 270 150 - 400 K/uL   nRBC 0.0 0.0 - 0.2 %   Neutrophils Relative % 34 %   Neutro Abs 2.0 1.7 - 7.7 K/uL   Lymphocytes Relative 56 %   Lymphs Abs 3.3 0.7 - 4.0 K/uL   Monocytes Relative 8 %   Monocytes Absolute 0.5 0.1 - 1.0 K/uL   Eosinophils Relative 2 %   Eosinophils Absolute 0.1 0.0 - 0.5 K/uL   Basophils Relative 0 %   Basophils Absolute 0.0 0.0 - 0.1 K/uL   Immature Granulocytes 0 %   Abs Immature Granulocytes 0.01 0.00 - 0.07 K/uL  I-Stat beta hCG blood, ED  Result Value Ref Range   I-stat hCG, quantitative <5.0 <5 mIU/mL   Comment 3           Ct Angio Chest Pe W/cm &/or Wo Cm Result Date: 04/15/2019 CLINICAL DATA:  43 year old female with a history of chest pain and shortness of breath. History of prior pulmonary embolism EXAM: CT ANGIOGRAPHY CHEST WITH CONTRAST TECHNIQUE: Multidetector CT imaging of the chest was performed using the standard protocol during bolus administration of intravenous contrast. Multiplanar CT image reconstructions and MIPs were obtained to evaluate the vascular anatomy. CONTRAST:  18mL OMNIPAQUE IOHEXOL 350 MG/ML SOLN COMPARISON:  03/02/2018, 07/17/2016 FINDINGS: Cardiovascular: Heart: Heart size unchanged with borderline cardiomegaly. No pericardial fluid/thickening. No calcified coronary artery disease. Aorta: Unremarkable course, caliber, contour of the thoracic aorta. No aneurysm or dissection flap. No periaortic fluid. Pulmonary arteries: Main pulmonary artery is again dilated. Transverse dimension measures 44 mm on today's CT. Previous CT estimated diameter 42 mm. No central filling defects of the main pulmonary artery, lobar arteries, segmental arteries, or proximal subsegmental branches. There is linear filling defects/webbing of the right lower lobe lobar pulmonary artery (image 42 of  series 6). No calcifications of the  pulmonary arteries. Mediastinum/Nodes: No mediastinal adenopathy. Unremarkable appearance of the thoracic esophagus. Unremarkable thoracic inlet Lungs/Pleura: Geographic ground-glass opacities of the bilateral lungs, most pronounced in the lower lungs at the dependent aspects. No pleural effusion or pneumothorax. No confluent airspace disease. No endotracheal debris. Minimal nonocclusive debris of the left mainstem bronchus. Upper Abdomen: Cholecystectomy Musculoskeletal: No acute displaced fracture. Degenerative changes of the spine. Review of the MIP images confirms the above findings. IMPRESSION: CT negative for acute pulmonary emboli. Evidence of chronic pulmonary emboli. Similar appearance of dilated main pulmonary artery. If there is concern for underlying pulmonary hypertension, referral for pulmonary evaluation and correlation with ECHO may be useful. Geographic ground-glass opacity of the dependent lungs bilaterally, can be seen with atelectasis, small airway disease, and/or small vessel disease. Electronically Signed   By: Gilmer MorJaime  Wagner D.O.   On: 04/15/2019 13:01    Evorn GongSherry R [place] was evaluated in Emergency Department on 04/15/2019 for the symptoms described in the history of present illness. She was evaluated in the context of the global COVID-19 pandemic, which necessitated consideration that the patient might be at risk for infection with the SARS-CoV-2 virus that causes COVID-19. Institutional protocols and algorithms that pertain to the evaluation of patients at risk for COVID-19 are in a state of rapid change based on information released by regulatory bodies including the CDC and federal and state organizations. These policies and algorithms were followed during the patient's care in the ED.   1415:  Potassium repleted PO. Doubt PE as cause for symptoms with negative CTA for new PE.  Doubt ACS as cause for symptoms with normal troponin x2 and no changes on today's EKG compared with previous  EKG after 4 days of constant atypical symptoms. Due to pt's hx travel, outpt COVID testing obtained. No indication for admission at this time. Tx symptomatically. Dx and testing, as well as incidental finding(s), d/w pt.  Questions answered.  Verb understanding, agreeable to d/c home with outpt f/u.    Final Clinical Impressions(s) / ED Diagnoses   Final diagnoses:  None    ED Discharge Orders    None       Samuel JesterMcManus, Chrisette Man, DO 04/18/19 [id number]

## 2019-04-17 LAB — NOVEL CORONAVIRUS, NAA (HOSP ORDER, SEND-OUT TO REF LAB; TAT 18-24 HRS): SARS-CoV-2, NAA: NOT DETECTED

## 2021-12-18 ENCOUNTER — Other Ambulatory Visit: Payer: Self-pay

## 2021-12-18 ENCOUNTER — Encounter (HOSPITAL_COMMUNITY): Payer: Self-pay

## 2021-12-18 ENCOUNTER — Emergency Department (HOSPITAL_COMMUNITY)
Admission: EM | Admit: 2021-12-18 | Discharge: 2021-12-19 | Disposition: A | Discharge status: Home / Self Care | Payer: BLUE CROSS/BLUE SHIELD | Attending: Emergency Medicine | Admitting: Emergency Medicine

## 2021-12-18 DIAGNOSIS — Z7901 Long term (current) use of anticoagulants: Secondary | ICD-10-CM | POA: Diagnosis not present

## 2021-12-18 DIAGNOSIS — J45909 Unspecified asthma, uncomplicated: Secondary | ICD-10-CM | POA: Insufficient documentation

## 2021-12-18 DIAGNOSIS — R1012 Left upper quadrant pain: Secondary | ICD-10-CM | POA: Insufficient documentation

## 2021-12-18 DIAGNOSIS — Z79899 Other long term (current) drug therapy: Secondary | ICD-10-CM | POA: Diagnosis not present

## 2021-12-18 DIAGNOSIS — R0602 Shortness of breath: Secondary | ICD-10-CM | POA: Diagnosis not present

## 2021-12-18 DIAGNOSIS — R112 Nausea with vomiting, unspecified: Secondary | ICD-10-CM | POA: Diagnosis not present

## 2021-12-18 HISTORY — DX: Other pulmonary embolism without acute cor pulmonale: I26.99

## 2021-12-18 NOTE — ED Triage Notes (Signed)
Pt brought in by [facility] for c/o abd pain and SOB x 3 days. Pt says nausea and vomiting started today. Pt 100% on room air and BP per EMS was 180/98, HR around 65.  ?

## 2021-12-18 NOTE — ED Notes (Signed)
ED Provider at bedside. 

## 2021-12-19 ENCOUNTER — Emergency Department (HOSPITAL_COMMUNITY): Payer: BLUE CROSS/BLUE SHIELD

## 2021-12-19 DIAGNOSIS — R1012 Left upper quadrant pain: Secondary | ICD-10-CM | POA: Diagnosis not present

## 2021-12-19 LAB — CBC WITH DIFFERENTIAL/PLATELET
Abs Immature Granulocytes: 0.02 10*3/uL (ref 0.00–0.07)
Basophils Absolute: 0 10*3/uL (ref 0.0–0.1)
Basophils Relative: 0 %
Eosinophils Absolute: 0.1 10*3/uL (ref 0.0–0.5)
Eosinophils Relative: 1 %
HCT: 38.2 % (ref 36.0–46.0)
Hemoglobin: 12.4 g/dL (ref 12.0–15.0)
Immature Granulocytes: 0 %
Lymphocytes Relative: 53 %
Lymphs Abs: 4.3 10*3/uL — ABNORMAL HIGH (ref 0.7–4.0)
MCH: 30 pg (ref 26.0–34.0)
MCHC: 32.5 g/dL (ref 30.0–36.0)
MCV: 92.5 fL (ref 80.0–100.0)
Monocytes Absolute: 0.6 10*3/uL (ref 0.1–1.0)
Monocytes Relative: 7 %
Neutro Abs: 3.2 10*3/uL (ref 1.7–7.7)
Neutrophils Relative %: 39 %
Platelets: 362 10*3/uL (ref 150–400)
RBC: 4.13 MIL/uL (ref 3.87–5.11)
RDW: 14.6 % (ref 11.5–15.5)
WBC: 8.2 10*3/uL (ref 4.0–10.5)
nRBC: 0 % (ref 0.0–0.2)

## 2021-12-19 LAB — COMPREHENSIVE METABOLIC PANEL
ALT: 19 U/L (ref 0–44)
AST: 21 U/L (ref 15–41)
Albumin: 4.2 g/dL (ref 3.5–5.0)
Alkaline Phosphatase: 42 U/L (ref 38–126)
Anion gap: 10 (ref 5–15)
BUN: 14 mg/dL (ref 6–20)
CO2: 24 mmol/L (ref 22–32)
Calcium: 9.4 mg/dL (ref 8.9–10.3)
Chloride: 107 mmol/L (ref 98–111)
Creatinine, Ser: 0.85 mg/dL (ref 0.44–1.00)
GFR, Estimated: >60 mL/min (ref >60)
Glucose, Bld: 107 mg/dL — ABNORMAL HIGH (ref 70–99)
Potassium: 3.8 mmol/L (ref 3.5–5.1)
Sodium: 141 mmol/L (ref 135–145)
Total Bilirubin: 0.9 mg/dL (ref 0.3–1.2)
Total Protein: 8.1 g/dL (ref 6.5–8.1)

## 2021-12-19 LAB — URINALYSIS, ROUTINE W REFLEX MICROSCOPIC
Bilirubin Urine: NEGATIVE
Glucose, UA: NEGATIVE mg/dL
Hgb urine dipstick: NEGATIVE
Ketones, ur: NEGATIVE mg/dL
Leukocytes,Ua: NEGATIVE
Nitrite: NEGATIVE
Protein, ur: NEGATIVE mg/dL
Specific Gravity, Urine: 1.019 (ref 1.005–1.030)
pH: 5 (ref 5.0–8.0)

## 2021-12-19 LAB — LIPASE, BLOOD: Lipase: 65 U/L — ABNORMAL HIGH (ref 11–51)

## 2021-12-19 LAB — PREGNANCY, URINE: Preg Test, Ur: NEGATIVE

## 2021-12-19 MED ORDER — IOHEXOL 300 MG/ML  SOLN
100.0000 mL | Freq: Once | INTRAMUSCULAR | Status: AC | PRN
  Administered 2021-12-19: 100 mL via INTRAVENOUS

## 2021-12-19 MED ORDER — ONDANSETRON HCL 4 MG/2ML IJ SOLN
4.0000 mg | Freq: Once | INTRAMUSCULAR | Status: AC
  Administered 2021-12-19: 4 mg via INTRAVENOUS
  Filled 2021-12-19: qty 2

## 2021-12-19 MED ORDER — MORPHINE SULFATE (PF) 4 MG/ML IV SOLN
4.0000 mg | Freq: Once | INTRAVENOUS | Status: AC
  Administered 2021-12-19: 4 mg via INTRAVENOUS
  Filled 2021-12-19: qty 1

## 2021-12-19 MED ORDER — ONDANSETRON 4 MG PO TBDP
4.0000 mg | ORAL_TABLET | Freq: Three times a day (TID) | ORAL | 0 refills | Status: AC | PRN
Start: 2021-12-19 — End: ?

## 2021-12-19 NOTE — ED Notes (Signed)
Pt states she is unable to get  urine sample at this time.  ?

## 2021-12-19 NOTE — Discharge Instructions (Signed)
You were seen today for abdominal pain and shortness of breath.  Your work-up is largely reassuring.  Continue your medications for your blood clots.  Take Zofran as needed for nausea.  Make sure to stay hydrated. ?

## 2021-12-19 NOTE — ED Provider Notes (Signed)
[facility] EMERGENCY DEPARTMENT Provider Note   CSN: [id number] Arrival date & time: 12/18/21  2329     History  Chief Complaint  Patient presents with   Abdominal Pain   Shortness of Breath    Leah Gardner is a 46 y.o. female.  HPI    This is a 46 year old female who presents with shortness of breath and abdominal pain.  History of blood clots on Eliquis.  Reports over the last 3 days she has had pain in her left upper quadrant left lower chest.  She subsequently has developed nausea and vomiting.  Reports that nausea and vomiting is nonbilious, nonbloody.  She is also had some shortness of breath.  No chest pain.  Patient does have a history of multiple abdominal surgeries.  She reports difficulty with bowel movements recently.   Home Medications Prior to Admission medications   Medication Sig Start Date End Date Taking? Authorizing Provider  ondansetron (ZOFRAN-ODT) 4 MG disintegrating tablet Take 1 tablet (4 mg total) by mouth every 8 (eight) hours as needed for nausea or vomiting. 12/19/21  Yes Aryanna Shaver, Mayer Masker, MD  divalproex (DEPAKOTE ER) 500 MG 24 hr tablet Take 500 mg by mouth 3 (three) times daily.     [provider]  ELIQUIS 5 MG TABS tablet Take 5 mg by mouth 2 (two) times daily. 03/24/19   [provider]  enoxaparin (LOVENOX) 120 MG/0.8ML injection Inject 0.77 mLs (115 mg total) into the skin every 12 (twelve) hours. Patient not taking: Reported on 04/15/2019 07/18/16   Glynn Octave, MD  escitalopram (LEXAPRO) 20 MG tablet Take 20 mg by mouth daily.    [provider]  HYDROcodone-acetaminophen (NORCO/VICODIN) 5-325 MG tablet 1 or 2 tabs PO q8 hours prn pain 04/15/19   Samuel Jester, DO  levofloxacin (LEVAQUIN) 500 MG tablet Take 1 tablet (500 mg total) by mouth daily. Patient not taking: Reported on 04/15/2019 07/18/16   Rancour, Jeannett Senior, MD  LORazepam (ATIVAN) 1 MG tablet Take 1 mg by mouth daily as needed for anxiety (spells or  anxiety).    [provider]  methocarbamol (ROBAXIN) 500 MG tablet Take 2 tablets (1,000 mg total) by mouth 4 (four) times daily as needed for muscle spasms (muscle spasm/pain). 04/15/19   Samuel Jester, DO      Allergies    Amoxicillin, Aspirin, Eggs or egg-derived products, Ibuprofen, Penicillins, Sulfonamide derivatives, and Tomato    Review of Systems   Review of Systems  Constitutional:  Negative for fever.  Respiratory:  Positive for shortness of breath. Negative for cough.   Cardiovascular:  Negative for chest pain.  Gastrointestinal:  Positive for abdominal pain, constipation, nausea and vomiting.  Genitourinary:  Negative for dysuria.  All other systems reviewed and are negative.  Physical Exam Updated Vital Signs BP 110/62    Pulse 66    Temp 97.9 F (36.6 C) (Oral)    Resp 17    Ht 1.702 m (5\' 7" )    Wt 95.3 kg    SpO2 96%    BMI 32.89 kg/m  Physical Exam Vitals and nursing note reviewed.  Constitutional:      Appearance: She is well-developed. She is obese. She is not ill-appearing.  HENT:     Head: Normocephalic and atraumatic.  Eyes:     Pupils: Pupils are equal, round, and reactive to light.  Cardiovascular:     Rate and Rhythm: Normal rate and regular rhythm.     Heart sounds: Normal heart  sounds.  Pulmonary:     Effort: Pulmonary effort is normal. No respiratory distress.     Breath sounds: No wheezing.  Abdominal:     General: Bowel sounds are normal.     Palpations: Abdomen is soft.     Tenderness: There is abdominal tenderness in the left upper quadrant. There is no guarding or rebound.  Musculoskeletal:     Cervical back: Neck supple.  Skin:    General: Skin is warm and dry.  Neurological:     Mental Status: She is alert and oriented to person, place, and time.  Psychiatric:        Mood and Affect: Mood normal.    ED Results / Procedures / Treatments   Labs (all labs ordered are listed, but only abnormal results are displayed) Labs  Reviewed  CBC WITH DIFFERENTIAL/PLATELET - Abnormal; Notable for the following components:      Result Value   Lymphs Abs 4.3 (*)    All other components within normal limits  COMPREHENSIVE METABOLIC PANEL - Abnormal; Notable for the following components:   Glucose, Bld 107 (*)    All other components within normal limits  LIPASE, BLOOD - Abnormal; Notable for the following components:   Lipase 65 (*)    All other components within normal limits  URINALYSIS, ROUTINE W REFLEX MICROSCOPIC  PREGNANCY, URINE    EKG EKG Interpretation  Date/Time:  Saturday December 18 2021 23:38:25 EST Ventricular Rate:  65 PR Interval:  263 QRS Duration: 106 QT Interval:  428 QTC Calculation: 445 R Axis:   64 Text Interpretation: Sinus rhythm Prolonged PR interval Confirmed by Ross Marcus ([id number]) on 12/19/2021 1:35:37 AM  Radiology CT ABDOMEN PELVIS W CONTRAST  Result Date: 12/19/2021 CLINICAL DATA:  Nausea and vomiting starting today EXAM: CT ABDOMEN AND PELVIS WITH CONTRAST TECHNIQUE: Multidetector CT imaging of the abdomen and pelvis was performed using the standard protocol following bolus administration of intravenous contrast. RADIATION DOSE REDUCTION: This exam was performed according to the departmental dose-optimization program which includes automated exposure control, adjustment of the mA and/or kV according to patient size and/or use of iterative reconstruction technique. CONTRAST:  OMNIPAQUE IOHEXOL 300 MG/ML  SOLN COMPARISON:  01/11/2010 FINDINGS: Lower chest:  Hazy density at the bases which is likely atelectasis. Hepatobiliary: There could be hepatic steatosis. Tiny density in the upper central liver which is too small for densitometry.Cholecystectomy. No bile duct dilatation Pancreas: Unremarkable. Spleen: Unremarkable. Adrenals/Urinary Tract: Negative adrenals. No hydronephrosis or ureteral stone. Punctate upper pole renal calculi seen on coronal reformats. Unremarkable bladder.  Stomach/Bowel: Diffuse formed stool in the colon without obstruction or rectal impaction. No small bowel dilatation. Appendectomy Vascular/Lymphatic: No acute vascular abnormality. No mass or adenopathy. Reproductive:No pathologic findings. Other: No ascites or pneumoperitoneum. Musculoskeletal: No acute abnormalities. Spine degeneration especially affecting the lower lumbar facets. No acute finding IMPRESSION: 1. No acute finding.  No bowel obstruction or visible inflammation. 2. Punctate renal calculi Electronically Signed   By: Tiburcio Pea M.D.   On: 12/19/2021 05:21   DG Abdomen Acute W/Chest  Result Date: 12/19/2021 CLINICAL DATA:  Shortness of breath with abdominal pain common nausea and vomiting. EXAM: DG ABDOMEN ACUTE WITH 1 VIEW CHEST COMPARISON:  Similar study of 02/17/2010 and CT abdomen and pelvis with contrast 01/11/2010 FINDINGS: Chest: The lungs are clear. Heart size and vasculature are normal. No pleural effusion is seen. Unremarkable mediastinal configuration. The thoracic cage intact. Abdomen films: Small-bowel dilatation in the mid to lower abdomen is  seen up to 3.8 cm worrisome for small bowel obstruction. Bowel gas intermixed with moderate stool retention is seen in the colon through into the rectum. Visceral shadows are stable but the gallbladder has been removed since the previous exam. No pathologic calcification is seen. No free air is evident. There are no acute skeletal findings with degenerative changes and slight dextroscoliosis of the lumbar spine noted and asymmetric joint space loss and spurring of the right hip. IMPRESSION: 1. Small-bowel dilatation up to 3.8 cm concerning for least partial small bowel obstruction. CT recommended. 2. Constipation with gas and stool remaining in the large intestine into the rectum. 3. Since 2011, interval cholecystectomy. 4. No evidence of acute chest disease. 5. Asymmetric right hip DJD. Electronically Signed   By: Almira Bar M.D.   On:  12/19/2021 03:55    Procedures Procedures    Medications Ordered in ED Medications  morphine (PF) 4 MG/ML injection 4 mg (4 mg Intravenous Given 12/19/21 0017)  ondansetron (ZOFRAN) injection 4 mg (4 mg Intravenous Given 12/19/21 0018)  iohexol (OMNIPAQUE) 300 MG/ML solution 100 mL (100 mLs Intravenous Contrast Given 12/19/21 0459)    ED Course/ Medical Decision Making/ A&P                           Medical Decision Making Amount and/or Complexity of Data Reviewed Labs: ordered. Radiology: ordered.  Risk Prescription drug management.   This patient presents to the ED for concern of abdominal pain, shortness of breath, this involves an extensive number of treatment options, and is a complaint that carries with it a high risk of complications and morbidity.  The differential diagnosis includes SBO, pancreatitis, gastritis, pneumonia, less likely PE as patient reports compliance with her blood thinners  MDM:    This is a 46 year old female who presents with shortness of breath and left upper quadrant pain.  She is nontoxic.  Vital signs are reassuring.  Patient was given pain and nausea medication.  Labs obtained.  No significant leukocytosis.  No significant metabolic derangement.  Lipase is just slightly elevated in the 60s.  Clinically, patient presentation is not consistent with pancreatitis.  Acute abdominal series shows no evidence of pneumonia or pneumothorax.  There is 1 dilated loop of bowel.  She would be at risk for bowel obstruction given significant prior surgical history.  For this reason, CT scan was obtained.  The scan does not show any evidence of acute obstruction.  Urinalysis shows no evidence of UTI.  On recheck, patient states she feels better.  She is tolerating fluids. (Labs, imaging)  Labs: I Ordered, and personally interpreted labs.  The pertinent results include: Lipase 65, otherwise normal CBC, CMP, urinalysis  Imaging Studies ordered: I ordered imaging  studies including acute abdominal series with dilated loops, CT abdomen normal I independently visualized and interpreted imaging. I agree with the radiologist interpretation  Additional history obtained from chart review.  External records from outside source obtained and reviewed including prior outpatient visits  Critical Interventions: IV pain and nausea medication  Consultations: I requested consultation with the NA,  and discussed lab and imaging findings as well as pertinent plan - they recommend: N/A  Cardiac Monitoring: The patient was maintained on a cardiac monitor.  I personally viewed and interpreted the cardiac monitored which showed an underlying rhythm of: Normal sinus rhythm  Reevaluation: After the interventions noted above, I reevaluated the patient and found that they have :improved  Considered admission for: Obstructive process  Social Determinants of Health: Lives independently  Disposition: Discharged  Co morbidities that complicate the patient evaluation  Past Medical History:  Diagnosis Date   Anxiety    Asthma    Bipolar disorder (HCC)    Chronic abdominal pain    Conversion disorder    GERD (gastroesophageal reflux disease)    IBS (irritable bowel syndrome)    Migraine    Pseudoseizure    Pulmonary embolism (HCC)    Schizophrenia (HCC)      Medicines Meds ordered this encounter  Medications   morphine (PF) 4 MG/ML injection 4 mg   ondansetron (ZOFRAN) injection 4 mg   iohexol (OMNIPAQUE) 300 MG/ML solution 100 mL   ondansetron (ZOFRAN-ODT) 4 MG disintegrating tablet    Sig: Take 1 tablet (4 mg total) by mouth every 8 (eight) hours as needed for nausea or vomiting.    Dispense:  20 tablet    Refill:  0    I have reviewed the patients home medicines and have made adjustments as needed  Problem List / ED Course: Problem List Items Addressed This Visit   None Visit Diagnoses     Left upper quadrant abdominal pain    -  Primary    Shortness of breath                       Final Clinical Impression(s) / ED Diagnoses Final diagnoses:  Left upper quadrant abdominal pain  Shortness of breath    Rx / DC Orders ED Discharge Orders          Ordered    ondansetron (ZOFRAN-ODT) 4 MG disintegrating tablet  Every 8 hours PRN        12/19/21 0553              Shon BatonHorton, Kaysa Roulhac F, MD 12/19/21 [phone]

## 2021-12-19 NOTE — ED Notes (Signed)
Patient transported to CT 

## 2023-05-31 ENCOUNTER — Other Ambulatory Visit: Payer: Self-pay

## 2023-05-31 ENCOUNTER — Emergency Department (HOSPITAL_COMMUNITY): Payer: Medicaid Other

## 2023-05-31 ENCOUNTER — Emergency Department (HOSPITAL_COMMUNITY)
Admission: EM | Admit: 2023-05-31 | Discharge: 2023-06-01 | Disposition: A | Discharge status: Home / Self Care | Payer: Medicaid Other | Attending: Emergency Medicine | Admitting: Emergency Medicine

## 2023-05-31 DIAGNOSIS — R0789 Other chest pain: Secondary | ICD-10-CM | POA: Diagnosis present

## 2023-05-31 DIAGNOSIS — Z7901 Long term (current) use of anticoagulants: Secondary | ICD-10-CM | POA: Diagnosis not present

## 2023-05-31 DIAGNOSIS — R079 Chest pain, unspecified: Secondary | ICD-10-CM

## 2023-05-31 DIAGNOSIS — I2699 Other pulmonary embolism without acute cor pulmonale: Secondary | ICD-10-CM | POA: Diagnosis not present

## 2023-05-31 DIAGNOSIS — F17210 Nicotine dependence, cigarettes, uncomplicated: Secondary | ICD-10-CM | POA: Diagnosis not present

## 2023-05-31 LAB — PROTIME-INR
INR: 1.7 — ABNORMAL HIGH (ref 0.8–1.2)
Prothrombin Time: 20.3 seconds — ABNORMAL HIGH (ref 11.4–15.2)

## 2023-05-31 LAB — CBC
HCT: 36.6 % (ref 36.0–46.0)
Hemoglobin: 11.9 g/dL — ABNORMAL LOW (ref 12.0–15.0)
MCH: 29.8 pg (ref 26.0–34.0)
MCHC: 32.5 g/dL (ref 30.0–36.0)
MCV: 91.5 fL (ref 80.0–100.0)
Platelets: 329 10*3/uL (ref 150–400)
RBC: 4 MIL/uL (ref 3.87–5.11)
RDW: 15.4 % (ref 11.5–15.5)
WBC: 6.2 10*3/uL (ref 4.0–10.5)
nRBC: 0 % (ref 0.0–0.2)

## 2023-05-31 LAB — BASIC METABOLIC PANEL
Anion gap: 8 (ref 5–15)
BUN: 7 mg/dL (ref 6–20)
CO2: 24 mmol/L (ref 22–32)
Calcium: 9.1 mg/dL (ref 8.9–10.3)
Chloride: 105 mmol/L (ref 98–111)
Creatinine, Ser: 0.79 mg/dL (ref 0.44–1.00)
GFR, Estimated: >60 mL/min (ref >60)
Glucose, Bld: 107 mg/dL — ABNORMAL HIGH (ref 70–99)
Potassium: 3.6 mmol/L (ref 3.5–5.1)
Sodium: 137 mmol/L (ref 135–145)

## 2023-05-31 LAB — TROPONIN I (HIGH SENSITIVITY): Troponin I (High Sensitivity): 3 ng/L (ref <18)

## 2023-05-31 MED ORDER — OXYCODONE-ACETAMINOPHEN 5-325 MG PO TABS
1.0000 | ORAL_TABLET | Freq: Once | ORAL | Status: AC
  Administered 2023-05-31: 1 via ORAL
  Filled 2023-05-31: qty 1

## 2023-05-31 NOTE — Discharge Instructions (Signed)
Follow-up with your family doctor for recheck, you will need to have your warfarin level rechecked within 1 week, please adhere to your new dosing guidelines.  ER for severe worsening symptoms

## 2023-05-31 NOTE — ED Triage Notes (Signed)
Pt c/o chest pain and SOB that began this morning, pain has increased since. States she has a hx of PE and was recently told to stop taking Eliquis is now on Warfarin.

## 2023-05-31 NOTE — ED Provider Notes (Signed)
[place] EMERGENCY DEPARTMENT AT [facility] Provider Note   CSN: [id number] Arrival date & time: 05/31/23  2158     History  Chief Complaint  Patient presents with   Chest Pain    Leah Gardner is a 47 y.o. female.   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  She has no swelling of her legs, she does still smoke cigarettes, she is unsure whether she has cancer in her lungs, she is seeing the cancer center at [facility] Medications Prior to Admission medications   Medication Sig Start Date End Date Taking? Authorizing Provider  divalproex (DEPAKOTE ER) 500 MG 24 hr tablet Take 500 mg by mouth 3 (three) times daily.     [provider]  ELIQUIS 5 MG TABS tablet Take 5 mg by mouth 2 (two) times daily. 03/24/19   [provider]  enoxaparin (LOVENOX) 120 MG/0.8ML injection Inject 0.77 mLs (115 mg total) into the skin every 12 (twelve) hours. Patient not taking: Reported on 04/15/2019 07/18/16   Glynn Octave, MD  escitalopram (LEXAPRO) 20 MG tablet Take 20 mg by mouth daily.    [provider]  HYDROcodone-acetaminophen (NORCO/VICODIN)  5-325 MG tablet 1 or 2 tabs PO q8 hours prn pain 04/15/19   Samuel Jester, DO  levofloxacin (LEVAQUIN) 500 MG tablet Take 1 tablet (500 mg total) by mouth daily. Patient not taking: Reported on 04/15/2019 07/18/16   Rancour, Jeannett Senior, MD  LORazepam (ATIVAN) 1 MG tablet Take 1 mg by mouth daily as needed for anxiety (spells or anxiety).    [provider]  methocarbamol (ROBAXIN) 500 MG tablet Take 2 tablets (1,000 mg total) by mouth 4 (four) times daily as needed for muscle spasms (muscle spasm/pain). 04/15/19   Samuel Jester, DO  ondansetron (ZOFRAN-ODT) 4 MG disintegrating tablet Take 1 tablet (4 mg total) by mouth every 8 (eight) hours as needed for nausea or vomiting. 12/19/21   Horton, Mayer Masker, MD      Allergies    Amoxicillin, Aspirin, Egg-derived products, Ibuprofen, Penicillins, Sulfonamide derivatives, and Tomato    Review of Systems   Review of Systems  Constitutional:  Negative for chills and fever.  HENT:  Negative for sore throat.   Eyes:  Negative for visual disturbance.  Respiratory:  Negative for cough and shortness of breath.   Cardiovascular:  Positive for chest pain.  Gastrointestinal:  Negative for abdominal pain, diarrhea, nausea and vomiting.  Genitourinary:  Negative for dysuria and frequency.  Musculoskeletal:  Negative for back pain and neck pain.  Skin:  Negative for rash.  Neurological:  Negative for weakness, numbness and headaches.  Hematological:  Negative for adenopathy.  Psychiatric/Behavioral:  Negative for behavioral problems.     Physical Exam Updated Vital Signs BP (!) 160/119   Pulse 78   Resp 16   Ht 1.727 m (5\' 8" )   Wt 93 kg   SpO2 99%   BMI 31.17 kg/m  Physical Exam Vitals and nursing note reviewed.  Constitutional:      General: She is not in acute distress.    Appearance: She is well-developed.  HENT:     Head: Normocephalic and atraumatic.     Mouth/Throat:     Pharynx: No oropharyngeal exudate.  Eyes:     General:  No scleral icterus.       Right eye: No discharge.        Left eye: No discharge.     Conjunctiva/sclera: Conjunctivae normal.     Pupils: Pupils are equal, round, and reactive to light.  Neck:     Thyroid: No thyromegaly.     Vascular: No JVD.  Cardiovascular:     Rate and Rhythm: Normal rate and regular rhythm.     Heart sounds: Normal heart sounds. No murmur heard.    No friction rub. No gallop.  Pulmonary:     Effort: Pulmonary effort is normal. No respiratory distress.     Breath sounds: Normal breath sounds. No wheezing or rales.  Abdominal:     General: Bowel sounds are normal. There is no distension.     Palpations: Abdomen is soft. There is no mass.     Tenderness: There is no abdominal tenderness.  Musculoskeletal:        General: No tenderness. Normal range of motion.     Cervical back: Normal range of motion and neck supple.  Lymphadenopathy:     Cervical: No cervical adenopathy.  Skin:    General: Skin is warm and dry.     Findings: No erythema or rash.  Neurological:     Mental Status: She is alert.     Coordination: Coordination normal.  Psychiatric:        Behavior: Behavior normal.     ED Results / Procedures / Treatments   Labs (all labs ordered are listed, but only abnormal results are displayed) Labs Reviewed  BASIC METABOLIC PANEL - Abnormal; Notable for the following components:      Result Value   Glucose, Bld 107 (*)    All other components within normal limits  CBC - Abnormal; Notable for the following components:   Hemoglobin 11.9 (*)    All other components within normal limits  PROTIME-INR - Abnormal; Notable for the following components:   Prothrombin Time 20.3 (*)    INR 1.7 (*)    All other components within normal limits  TROPONIN I (HIGH SENSITIVITY)  TROPONIN I (HIGH SENSITIVITY)    EKG EKG Interpretation Date/Time:  Wednesday May 31 2023 22:07:10 EDT Ventricular Rate:  70 PR Interval:  193 QRS Duration:  92 QT  Interval:  397 QTC Calculation: 429 R Axis:   62  Text Interpretation: Sinus rhythm Confirmed by Eber Hong ([id number]) on 05/31/2023 10:25:41 PM  Radiology DG Chest 2 View  Result Date: 05/31/2023 CLINICAL DATA:  Chest pain and shortness of breath EXAM: CHEST - 2 VIEW COMPARISON:  Chest x-ray 03/10/2023 FINDINGS: The heart size and mediastinal contours are within normal limits. Both lungs are clear. The visualized skeletal structures are unremarkable. IMPRESSION: No active cardiopulmonary disease. Electronically Signed  By: Darliss Cheney M.D.   On: 05/31/2023 23:29    Procedures Procedures    Medications Ordered in ED Medications  oxyCODONE-acetaminophen (PERCOCET/ROXICET) 5-325 MG per tablet 1 tablet (1 tablet Oral Given 05/31/23 2326)    ED Course/ Medical Decision Making/ A&P                                 Medical Decision Making Amount and/or Complexity of Data Reviewed Labs: ordered. Radiology: ordered.  Risk Prescription drug management.   Thankfully the patient has a very unremarkable exam, she is not tachycardic hypoxic or hypotensive, she is not febrile, she is very well-appearing, I do not think she needs an angiogram, will check a chest x-ray for to make sure there is no pneumothorax, EKG is reassuring, no signs of ischemia, labs pending, anticipate discharge home.  Patient agreeable  Thankfully the patient's workup is been unremarkable including lab work CBC, troponin and chest x-ray  This includes a normal metabolic panel, there is no anemia, no leukocytosis, troponin of 3 and an INR of 1.7 which is better than it was in the past.  Chest x-ray without pneumothorax  Patient will be referred to local hematology oncology        Final Clinical Impression(s) / ED Diagnoses Final diagnoses:  Nonspecific chest pain  Pulmonary embolism, other, unspecified chronicity, unspecified whether acute cor pulmonale present (HCC)     Eber Hong, MD 05/31/23  2343

## 2023-05-31 NOTE — ED Notes (Addendum)
IV attempt x 1 unsuccessful, site bandaged and not actively bleeding.

## 2023-06-05 ENCOUNTER — Inpatient Hospital Stay: Payer: Medicaid Other | Admitting: Oncology

## 2023-06-14 ENCOUNTER — Inpatient Hospital Stay: Payer: Medicaid Other

## 2023-06-14 ENCOUNTER — Inpatient Hospital Stay: Payer: Medicaid Other | Attending: Oncology | Admitting: Oncology

## 2023-06-14 ENCOUNTER — Other Ambulatory Visit: Payer: Self-pay | Admitting: Oncology

## 2023-06-14 ENCOUNTER — Encounter: Payer: Self-pay | Admitting: Oncology

## 2023-06-14 VITALS — BP 101/80 | HR 64 | Temp 98.6°F | Resp 17 | Ht 68.0 in | Wt 250.3 lb

## 2023-06-14 DIAGNOSIS — Z7901 Long term (current) use of anticoagulants: Secondary | ICD-10-CM | POA: Insufficient documentation

## 2023-06-14 DIAGNOSIS — Z87891 Personal history of nicotine dependence: Secondary | ICD-10-CM | POA: Insufficient documentation

## 2023-06-14 DIAGNOSIS — D472 Monoclonal gammopathy: Secondary | ICD-10-CM | POA: Diagnosis not present

## 2023-06-14 DIAGNOSIS — I2699 Other pulmonary embolism without acute cor pulmonale: Secondary | ICD-10-CM | POA: Insufficient documentation

## 2023-06-14 DIAGNOSIS — R079 Chest pain, unspecified: Secondary | ICD-10-CM | POA: Diagnosis not present

## 2023-06-14 DIAGNOSIS — Z886 Allergy status to analgesic agent status: Secondary | ICD-10-CM | POA: Insufficient documentation

## 2023-06-14 DIAGNOSIS — Z880 Allergy status to penicillin: Secondary | ICD-10-CM | POA: Insufficient documentation

## 2023-06-14 DIAGNOSIS — Z86711 Personal history of pulmonary embolism: Secondary | ICD-10-CM | POA: Insufficient documentation

## 2023-06-14 DIAGNOSIS — F419 Anxiety disorder, unspecified: Secondary | ICD-10-CM | POA: Diagnosis not present

## 2023-06-14 DIAGNOSIS — R200 Anesthesia of skin: Secondary | ICD-10-CM | POA: Diagnosis not present

## 2023-06-14 DIAGNOSIS — Z882 Allergy status to sulfonamides status: Secondary | ICD-10-CM | POA: Diagnosis not present

## 2023-06-14 DIAGNOSIS — M25512 Pain in left shoulder: Secondary | ICD-10-CM | POA: Diagnosis not present

## 2023-06-14 DIAGNOSIS — I2782 Chronic pulmonary embolism: Secondary | ICD-10-CM

## 2023-06-14 DIAGNOSIS — E669 Obesity, unspecified: Secondary | ICD-10-CM | POA: Diagnosis not present

## 2023-06-14 DIAGNOSIS — M7989 Other specified soft tissue disorders: Secondary | ICD-10-CM | POA: Diagnosis not present

## 2023-06-14 DIAGNOSIS — Z79899 Other long term (current) drug therapy: Secondary | ICD-10-CM | POA: Insufficient documentation

## 2023-06-14 DIAGNOSIS — R202 Paresthesia of skin: Secondary | ICD-10-CM | POA: Diagnosis not present

## 2023-06-14 LAB — APTT: aPTT: 34 s (ref 24–36)

## 2023-06-14 LAB — CBC WITH DIFFERENTIAL/PLATELET
Abs Immature Granulocytes: 0.02 10*3/uL (ref 0.00–0.07)
Basophils Absolute: 0 10*3/uL (ref 0.0–0.1)
Basophils Relative: 0 %
Eosinophils Absolute: 0.1 10*3/uL (ref 0.0–0.5)
Eosinophils Relative: 2 %
HCT: 40.5 % (ref 36.0–46.0)
Hemoglobin: 12.6 g/dL (ref 12.0–15.0)
Immature Granulocytes: 0 %
Lymphocytes Relative: 55 %
Lymphs Abs: 3.2 10*3/uL (ref 0.7–4.0)
MCH: 29.3 pg (ref 26.0–34.0)
MCHC: 31.1 g/dL (ref 30.0–36.0)
MCV: 94.2 fL (ref 80.0–100.0)
Monocytes Absolute: 0.3 10*3/uL (ref 0.1–1.0)
Monocytes Relative: 6 %
Neutro Abs: 2.1 10*3/uL (ref 1.7–7.7)
Neutrophils Relative %: 37 %
Platelets: 354 10*3/uL (ref 150–400)
RBC: 4.3 MIL/uL (ref 3.87–5.11)
RDW: 15.5 % (ref 11.5–15.5)
WBC: 5.8 10*3/uL (ref 4.0–10.5)
nRBC: 0 % (ref 0.0–0.2)

## 2023-06-14 LAB — COMPREHENSIVE METABOLIC PANEL
ALT: 22 U/L (ref 0–44)
AST: 21 U/L (ref 15–41)
Albumin: 4.1 g/dL (ref 3.5–5.0)
Alkaline Phosphatase: 49 U/L (ref 38–126)
Anion gap: 9 (ref 5–15)
BUN: 11 mg/dL (ref 6–20)
CO2: 26 mmol/L (ref 22–32)
Calcium: 9.4 mg/dL (ref 8.9–10.3)
Chloride: 105 mmol/L (ref 98–111)
Creatinine, Ser: 0.91 mg/dL (ref 0.44–1.00)
GFR, Estimated: >60 mL/min (ref >60)
Glucose, Bld: 99 mg/dL (ref 70–99)
Potassium: 4 mmol/L (ref 3.5–5.1)
Sodium: 140 mmol/L (ref 135–145)
Total Bilirubin: 0.9 mg/dL (ref 0.3–1.2)
Total Protein: 8.3 g/dL — ABNORMAL HIGH (ref 6.5–8.1)

## 2023-06-14 LAB — PROTIME-INR
INR: 1.9 — ABNORMAL HIGH (ref 0.8–1.2)
Prothrombin Time: 22.3 s — ABNORMAL HIGH (ref 11.4–15.2)

## 2023-06-14 MED ORDER — WARFARIN SODIUM 10 MG PO TABS: 10.0000 mg | ORAL_TABLET | Freq: Every day | ORAL | 0 refills | Status: DC

## 2023-06-14 MED ORDER — ENOXAPARIN SODIUM 100 MG/ML IJ SOSY: 100.0000 mg | PREFILLED_SYRINGE | Freq: Two times a day (BID) | INTRAMUSCULAR | Status: DC

## 2023-06-14 MED ORDER — ENOXAPARIN SODIUM 40 MG/0.4ML IJ SOSY
100.0000 mg | PREFILLED_SYRINGE | Freq: Two times a day (BID) | INTRAMUSCULAR | 0 refills | Status: DC
Start: 2023-06-14 — End: 2023-06-26

## 2023-06-14 NOTE — Addendum Note (Signed)
Addended by: Neita Goodnight on: 06/14/2023 12:05 PM   Modules accepted: Orders

## 2023-06-14 NOTE — Assessment & Plan Note (Addendum)
Patient with a history of multiple PEs and as per patient and chart review has failed Xarelto and Eliquis before Patient took last Coumadin a few days ago-will get baseline labs today along with an INR Patient was started on Lovenox with an overlap of Coumadin recently but stated that she did not take it as prescribed due to misunderstanding We discussed that Lovenox with an overlap of Coumadin until the INR goal of 2 is reached and then continuing Coumadin with frequent INR checks might be the best option for the patient We discussed the importance of frequent INR checks with Coumadin intake and patient is willing to go to Coumadin clinic in [place] for the same Will recommend starting Lovenox today at 100 mg twice daily and restart Coumadin 10 mg daily.  Goal is to overlap until INR is greater than or equal to 2 for at least 2 measurements taken 24 hours apart with a duration of overlap for at least 5 days Goal of INR is 2-3 Briefly discussed dietary interactions with Coumadin but would defer to Coumadin clinic to manage further on Patient had previous thrombophilia workup done which was nondiagnostic, we discussed that she would need lifelong anticoagulation for recurrent pulmonary embolism and it will not change with more thrombophilia testing.  Would not consider doing it at this point Also discussed weight loss, as obesity is a high risk factor for thrombosis  Return to clinic in 4 weeks for follow-up

## 2023-06-14 NOTE — Assessment & Plan Note (Signed)
Patient has a swelling on the right lateral side of the thigh 10 into 5 cm Soft, mildly tender, nonerythematous Will obtain an ultrasound to rule out hematoma

## 2023-06-14 NOTE — Assessment & Plan Note (Signed)
As per documentation patient has a history of MGUS with a small M spike CBC and CMP within normal limits today Will repeat SPEP, IFE with lab draw at next visit
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
## 2023-06-15 ENCOUNTER — Other Ambulatory Visit: Payer: Self-pay

## 2023-06-15 MED ORDER — WARFARIN SODIUM 10 MG PO TABS: 10.0000 mg | ORAL_TABLET | Freq: Every day | ORAL | 0 refills | Status: DC

## 2023-06-19 ENCOUNTER — Ambulatory Visit (HOSPITAL_COMMUNITY): Payer: Medicaid Other

## 2023-06-21 NOTE — Progress Notes (Signed)
Dr. Anders Simmonds asked to follow up with patient on finding Coumadin clinic closer to her in [place].    Called patient and patient stated she had not found clinic, but going to her PCP on 06/22/23. Patient also stated she had not started taking the coumadin because of insurance issues.

## 2023-06-22 ENCOUNTER — Other Ambulatory Visit: Payer: Self-pay | Admitting: Oncology

## 2023-06-26 ENCOUNTER — Other Ambulatory Visit: Payer: Self-pay

## 2023-06-26 ENCOUNTER — Other Ambulatory Visit: Payer: Self-pay | Admitting: Oncology

## 2023-06-26 MED ORDER — ENOXAPARIN SODIUM 40 MG/0.4ML IJ SOSY: 100.0000 mg | PREFILLED_SYRINGE | Freq: Two times a day (BID) | INTRAMUSCULAR | 0 refills | Status: DC

## 2023-06-26 MED ORDER — WARFARIN SODIUM 10 MG PO TABS: 10.0000 mg | ORAL_TABLET | Freq: Every day | ORAL | 0 refills | Status: AC

## 2023-06-26 NOTE — Progress Notes (Signed)
Created in error

## 2023-06-26 NOTE — Progress Notes (Signed)
Reached out to patient to see if she had picked up and started the Coumadin and found a Coumadin clinic to help take care of her as asked by Dr. Anders Simmonds.   Called patient and patient stated the drug store didn't have her lovenox that it was out of stock and she had not picked up the Coumadin. Dr. Anders Simmonds also put in a referral for a coumadin clinic for the patient.

## 2023-06-26 NOTE — Progress Notes (Signed)
Resent prescription to pharmacy for patient and a referral for coumadin clinic is being made.  Called patient and patient is aware.

## 2023-07-24 ENCOUNTER — Inpatient Hospital Stay: Payer: Medicaid Other | Admitting: Oncology

## 2023-08-08 ENCOUNTER — Other Ambulatory Visit: Payer: Self-pay | Admitting: Oncology

## 2023-08-08 ENCOUNTER — Telehealth: Payer: Self-pay | Admitting: *Deleted

## 2023-08-08 NOTE — Telephone Encounter (Signed)
Received a refill request for coumadin for patient.  She cancelled follow up with Dr. Anders Simmonds.  Reached out to patient, who states that she is seeing another provider and does not wish to follow up in our clinic.  Currently on lovenox per patient.  Dr. Anders Simmonds made aware.

## 2023-09-19 ENCOUNTER — Emergency Department (HOSPITAL_COMMUNITY): Payer: Medicaid Other

## 2023-09-19 ENCOUNTER — Other Ambulatory Visit: Payer: Self-pay

## 2023-09-19 ENCOUNTER — Encounter (HOSPITAL_COMMUNITY): Payer: Self-pay | Admitting: *Deleted

## 2023-09-19 ENCOUNTER — Emergency Department (HOSPITAL_COMMUNITY)
Admission: EM | Admit: 2023-09-19 | Discharge: 2023-09-19 | Disposition: A | Discharge status: Home / Self Care | Payer: Medicaid Other | Attending: Emergency Medicine | Admitting: Emergency Medicine

## 2023-09-19 DIAGNOSIS — M25572 Pain in left ankle and joints of left foot: Secondary | ICD-10-CM | POA: Insufficient documentation

## 2023-09-19 DIAGNOSIS — S99912A Unspecified injury of left ankle, initial encounter: Secondary | ICD-10-CM

## 2023-09-19 MED ORDER — ACETAMINOPHEN 500 MG PO TABS
1000.0000 mg | ORAL_TABLET | Freq: Once | ORAL | Status: AC
  Administered 2023-09-19: 1000 mg via ORAL
  Filled 2023-09-19: qty 2

## 2023-09-19 NOTE — Discharge Instructions (Addendum)
Evaluation today was overall reassuring.  Recommend you follow-up with orthopedics if your symptoms persist otherwise recommend conservative treatment at home and follow-up with your PCP.  X-ray does not demonstrate acute fracture at this time.  You may remove the cast at times to test at your ankle.  If your symptoms are improving you can discontinue the brace and the crutches.  Also recommend Tylenol ibuprofen for pain and apply ice 3-4 times a day for the next 3 days.

## 2023-09-19 NOTE — ED Triage Notes (Signed)
Pt BIB [facility] Left ankle pain and swelling after fall yesterday down 4 steps  128/89 98%

## 2023-09-19 NOTE — ED Provider Notes (Signed)
[place] EMERGENCY DEPARTMENT AT [facility] Provider Note   CSN: [id number] Arrival date & time: 09/19/23  1141     History  Chief Complaint  Patient presents with   Fall   HPI Leah Gardner is a 47 y.o. female presenting for fall.  Occurred yesterday evening.  Patient was walking down steps and tripped and fell down 4 steps and twisted her left ankle on the ground.  Denies head injury or loss of consciousness.  Still able to ambulate and bear weight but the ankle is very painful and swollen.  Reports remote surgery in the left ankle.  States "I have 13 screws in that ankle".    Fall       Home Medications Prior to Admission medications   Medication Sig Start Date End Date Taking? Authorizing Provider  ALPRAZolam Prudy Feeler) 0.5 MG tablet Take by mouth. 05/01/23   [provider]  enoxaparin (LOVENOX) 100 MG/ML injection Inject 1 mL (100 mg total) into the skin every 12 (twelve) hours. For 14 days 06/27/23   Cindie Crumbly, MD  escitalopram (LEXAPRO) 20 MG tablet Take 20 mg by mouth daily.    [provider]  HYDROcodone-acetaminophen (NORCO/VICODIN) 5-325 MG tablet 1 or 2 tabs PO q8 hours prn pain 04/15/19   Samuel Jester, DO  methocarbamol (ROBAXIN) 500 MG tablet Take 2 tablets (1,000 mg total) by mouth 4 (four) times daily as needed for muscle spasms (muscle spasm/pain). 04/15/19   Samuel Jester, DO  ondansetron (ZOFRAN-ODT) 4 MG disintegrating tablet Take 1 tablet (4 mg total) by mouth every 8 (eight) hours as needed for nausea or vomiting. 12/19/21   Horton, Mayer Masker, MD  venlafaxine XR (EFFEXOR-XR) 37.5 MG 24 hr capsule Take by mouth. 05/01/23   [provider]  warfarin (COUMADIN) 10 MG tablet Take 1 tablet (10 mg total) by mouth daily. 06/26/23   Cindie Crumbly, MD      Allergies    Amoxicillin, Aspirin, Egg-derived products, Ibuprofen, Penicillins, Sulfonamide derivatives, and Tomato    Review of Systems   See HPI for  pertinent positives  Physical Exam Updated Vital Signs BP 110/76   Pulse 64   Temp 98.1 F (36.7 C) (Oral)   Resp 16   Ht 5\' 8"  (1.727 m)   Wt 91.2 kg   SpO2 100%   BMI 30.56 kg/m  Physical Exam Constitutional:      Appearance: Normal appearance.  HENT:     Head: Normocephalic.     Nose: Nose normal.  Eyes:     Conjunctiva/sclera: Conjunctivae normal.  Pulmonary:     Effort: Pulmonary effort is normal.  Musculoskeletal:     Left ankle: Swelling present. No deformity. Tenderness present over the lateral malleolus and medial malleolus. Decreased range of motion. Normal pulse.  Neurological:     Mental Status: She is alert.  Psychiatric:        Mood and Affect: Mood normal.     ED Results / Procedures / Treatments   Labs (all labs ordered are listed, but only abnormal results are displayed) Labs Reviewed - No data to display  EKG None  Radiology DG Ankle Complete Left  Result Date: 09/19/2023 CLINICAL DATA:  Fall.  Surgery 7 years ago EXAM: LEFT ANKLE COMPLETE - 3+ VIEW COMPARISON:  07/14/2008 FINDINGS: Interval plate and screw fixation of the distal fibula. Lag screw fixation of the medial malleolus. Ossific density distal to the medial malleolus is likely related to remote trauma. Remote fracture of  the distal fibula. On the lateral view, subtle lucency is identified about the posterior distal fibular shaft. IMPRESSION: Extensive postsurgical and remote posttraumatic changes about the ankle. About the posterior distal fibular shaft of the lateral view, subtle lucency is most likely related to healed fracture. Correlate with point tenderness to exclude unlikely refracture. Electronically Signed   By: Jeronimo Greaves M.D.   On: 09/19/2023 13:49    Procedures Procedures    Medications Ordered in ED Medications  acetaminophen (TYLENOL) tablet 1,000 mg (has no administration in time range)    ED Course/ Medical Decision Making/ A&P                                  Medical Decision Making Amount and/or Complexity of Data Reviewed Radiology: ordered.   47 year old well-appearing female presenting for fall and ankle injury.  Exam notable for tenderness all about the malleolus.  DDx includes fracture, dislocation, sprain, other.  X-ray was relatively reassuring but did show lucency about the posterior distal fibular shaft.  I reassessed patient and patient deny tenderness in that area making acute fracture unlikely.  Applied ankle Aircast and gave her Crutches.  Advised her to follow-up with orthopedics if her persist.  Otherwise advised supportive treatment and follow-up with PCP.  Vital stable.  Discharged good condition.        Final Clinical Impression(s) / ED Diagnoses Final diagnoses:  Injury of left ankle, initial encounter    Rx / DC Orders ED Discharge Orders     None         Gareth Eagle, PA-C 09/19/23 1358    Loetta Rough, MD 09/19/23 1510

## 2024-04-28 ENCOUNTER — Emergency Department (HOSPITAL_COMMUNITY)
Admission: EM | Admit: 2024-04-28 | Discharge: 2024-04-28 | Disposition: A | Discharge status: Home / Self Care | Payer: Self-pay | Attending: Emergency Medicine | Admitting: Emergency Medicine

## 2024-04-28 ENCOUNTER — Emergency Department (HOSPITAL_COMMUNITY): Payer: Self-pay

## 2024-04-28 DIAGNOSIS — Z7901 Long term (current) use of anticoagulants: Secondary | ICD-10-CM | POA: Insufficient documentation

## 2024-04-28 DIAGNOSIS — R339 Retention of urine, unspecified: Secondary | ICD-10-CM | POA: Insufficient documentation

## 2024-04-28 DIAGNOSIS — M5441 Lumbago with sciatica, right side: Secondary | ICD-10-CM | POA: Insufficient documentation

## 2024-04-28 DIAGNOSIS — R1030 Lower abdominal pain, unspecified: Secondary | ICD-10-CM | POA: Insufficient documentation

## 2024-04-28 DIAGNOSIS — R338 Other retention of urine: Secondary | ICD-10-CM

## 2024-04-28 DIAGNOSIS — J45909 Unspecified asthma, uncomplicated: Secondary | ICD-10-CM | POA: Insufficient documentation

## 2024-04-28 LAB — CBC WITH DIFFERENTIAL/PLATELET
Abs Immature Granulocytes: 0.01 K/uL (ref 0.00–0.07)
Basophils Absolute: 0 K/uL (ref 0.0–0.1)
Basophils Relative: 0 %
Eosinophils Absolute: 0.1 K/uL (ref 0.0–0.5)
Eosinophils Relative: 1 %
HCT: 36.9 % (ref 36.0–46.0)
Hemoglobin: 11.9 g/dL — ABNORMAL LOW (ref 12.0–15.0)
Immature Granulocytes: 0 %
Lymphocytes Relative: 68 %
Lymphs Abs: 2.6 K/uL (ref 0.7–4.0)
MCH: 30.4 pg (ref 26.0–34.0)
MCHC: 32.2 g/dL (ref 30.0–36.0)
MCV: 94.4 fL (ref 80.0–100.0)
Monocytes Absolute: 0.3 K/uL (ref 0.1–1.0)
Monocytes Relative: 7 %
Neutro Abs: 0.9 K/uL — ABNORMAL LOW (ref 1.7–7.7)
Neutrophils Relative %: 24 %
Platelets: 265 K/uL (ref 150–400)
RBC: 3.91 MIL/uL (ref 3.87–5.11)
RDW: 14.6 % (ref 11.5–15.5)
WBC: 3.9 K/uL — ABNORMAL LOW (ref 4.0–10.5)
nRBC: 0 % (ref 0.0–0.2)

## 2024-04-28 LAB — URINALYSIS, ROUTINE W REFLEX MICROSCOPIC
Bilirubin Urine: NEGATIVE
Glucose, UA: NEGATIVE mg/dL
Hgb urine dipstick: NEGATIVE
Ketones, ur: NEGATIVE mg/dL
Leukocytes,Ua: NEGATIVE
Nitrite: NEGATIVE
Protein, ur: NEGATIVE mg/dL
Specific Gravity, Urine: 1.021 (ref 1.005–1.030)
pH: 5 (ref 5.0–8.0)

## 2024-04-28 LAB — COMPREHENSIVE METABOLIC PANEL WITH GFR
ALT: 28 U/L (ref 0–44)
AST: 22 U/L (ref 15–41)
Albumin: 4 g/dL (ref 3.5–5.0)
Alkaline Phosphatase: 41 U/L (ref 38–126)
Anion gap: 10 (ref 5–15)
BUN: 9 mg/dL (ref 6–20)
CO2: 20 mmol/L — ABNORMAL LOW (ref 22–32)
Calcium: 9.1 mg/dL (ref 8.9–10.3)
Chloride: 110 mmol/L (ref 98–111)
Creatinine, Ser: 0.9 mg/dL (ref 0.44–1.00)
GFR, Estimated: >60 mL/min (ref >60)
Glucose, Bld: 91 mg/dL (ref 70–99)
Potassium: 3.7 mmol/L (ref 3.5–5.1)
Sodium: 140 mmol/L (ref 135–145)
Total Bilirubin: 0.8 mg/dL (ref 0.0–1.2)
Total Protein: 7.7 g/dL (ref 6.5–8.1)

## 2024-04-28 LAB — PROTIME-INR
INR: 1.1 (ref 0.8–1.2)
Prothrombin Time: 14.3 s (ref 11.4–15.2)

## 2024-04-28 MED ORDER — METHOCARBAMOL 500 MG PO TABS: 500.0000 mg | ORAL_TABLET | Freq: Three times a day (TID) | ORAL | 0 refills | Status: AC

## 2024-04-28 MED ORDER — ONDANSETRON HCL 4 MG/2ML IJ SOLN
4.0000 mg | Freq: Once | INTRAMUSCULAR | Status: AC
  Administered 2024-04-28: 4 mg via INTRAVENOUS
  Filled 2024-04-28: qty 2

## 2024-04-28 MED ORDER — HYDROMORPHONE HCL 1 MG/ML IJ SOLN
0.5000 mg | Freq: Once | INTRAMUSCULAR | Status: AC
Start: 2024-04-28 — End: 2024-04-28
  Administered 2024-04-28: 0.5 mg via INTRAVENOUS
  Filled 2024-04-28: qty 0.5

## 2024-04-28 MED ORDER — OXYCODONE-ACETAMINOPHEN 5-325 MG PO TABS: 1.0000 | ORAL_TABLET | ORAL | 0 refills | Status: AC | PRN

## 2024-04-28 NOTE — ED Triage Notes (Signed)
 Pt c/o lower back pain radiating down her R leg. Pt states that she was recently diagnosed with a slipped disk that is causing her issues. Denies any saddle anesthesia or bowel/bladder incontinence.

## 2024-04-28 NOTE — ED Provider Notes (Signed)
 [place] EMERGENCY DEPARTMENT AT [facility] Provider Note   CSN: [id number] Arrival date & time: 04/28/24  9061     Patient presents with: Back Pain   Leah Gardner is a 48 y.o. female.  {Add pertinent medical, surgical, social history, OB history to HPI:32947}  Back Pain Associated symptoms: abdominal pain   Associated symptoms: no chest pain, no dysuria, no fever, no headaches, no numbness and no weakness        Leah Gardner is a 48 y.o. female with past medical history of migraine headaches, pseudoseizures, asthma, anxiety, bipolar disorder, schizophrenia, chronically anticoagulated secondary to PEs and chronic right hip pain who presents to the Emergency Department complaining of worsening pain of her right lower back right hip and pain radiating into her right leg and foot.  She has been followed by orthopedic clinic in [facility]  for the ongoing hip pain.  She began having pain of her right back and right leg for 1 month.  Was seen at the clinic in [facility] in mid June told that her symptoms were coming from her back and not her hip.  She describes burning shooting kind of pain from her back into her buttock that radiates through the lateral leg into her foot.  Pain is worse with certain movements and weightbearing.  She also complains of pain to her lower abdomen and states that she has not been able to urinate sine 10 pm last evening.   denies, numbness or weakness of the extremity, bowel changes, saddle anesthesias.  No recent fall or injury. States the pain was so bad this morning that she was unable to get out of bed without assistance.    She has follow-up appointment with her orthopedic provider in [facility] [place]  later this month  Prior to Admission medications   Medication Sig Start Date End Date Taking? Authorizing Provider  ALPRAZolam (XANAX) 0.5 MG tablet Take by mouth. 05/01/23   [provider]  enoxaparin  (LOVENOX ) 100 MG/ML injection  Inject 1 mL (100 mg total) into the skin every 12 (twelve) hours. For 14 days 06/27/23   Kandala, Hyndavi, MD  escitalopram (LEXAPRO) 20 MG tablet Take 20 mg by mouth daily.    [provider]  HYDROcodone -acetaminophen  (NORCO/VICODIN) 5-325 MG tablet 1 or 2 tabs PO q8 hours prn pain 04/15/19   Joyice Sauer, DO  methocarbamol  (ROBAXIN ) 500 MG tablet Take 2 tablets (1,000 mg total) by mouth 4 (four) times daily as needed for muscle spasms (muscle spasm/pain). 04/15/19   Joyice Sauer, DO  ondansetron  (ZOFRAN -ODT) 4 MG disintegrating tablet Take 1 tablet (4 mg total) by mouth every 8 (eight) hours as needed for nausea or vomiting. 12/19/21   Horton, Charmaine FALCON, MD  venlafaxine XR (EFFEXOR-XR) 37.5 MG 24 hr capsule Take by mouth. 05/01/23   [provider]  warfarin (COUMADIN ) 10 MG tablet Take 1 tablet (10 mg total) by mouth daily. 06/26/23   Kandala, Hyndavi, MD    Allergies: Amoxicillin, Aspirin, Egg-derived products, Ibuprofen, Penicillins, Sulfonamide derivatives, and Tomato    Review of Systems  Constitutional:  Negative for appetite change, chills and fever.  Respiratory:  Negative for cough and shortness of breath.   Cardiovascular:  Negative for chest pain.  Gastrointestinal:  Positive for abdominal pain. Negative for constipation, diarrhea, nausea and vomiting.  Genitourinary:  Positive for difficulty urinating. Negative for dysuria, flank pain and vaginal pain.  Musculoskeletal:  Positive for arthralgias (right hip pain) and back pain.  Skin:  Negative for rash and wound.  Neurological:  Negative for dizziness, weakness, numbness and headaches.  Psychiatric/Behavioral:  Negative for confusion.     Updated Vital Signs BP (!) 146/91 (BP Location: Right Arm)   Pulse 74   Temp 98.2 F (36.8 C) (Oral)   Resp 14   SpO2 94%   Physical Exam Vitals and nursing note reviewed.  Constitutional:      General: She is not in acute distress.    Appearance: She is not  ill-appearing or toxic-appearing.     Comments: Pt is tearful  HENT:     Mouth/Throat:     Mouth: Mucous membranes are moist.  Cardiovascular:     Rate and Rhythm: Normal rate and regular rhythm.     Pulses: Normal pulses.  Pulmonary:     Effort: Pulmonary effort is normal.  Abdominal:     Palpations: Abdomen is soft.     Tenderness: There is abdominal tenderness.  Musculoskeletal:        General: Tenderness present. No signs of injury.     Cervical back: Normal range of motion.     Lumbar back: Tenderness and bony tenderness present. Positive right straight leg raise test.     Comments: Ttp lower right paraspinal muscles and tenderness over the  lower lumbar spine and right SI joint space.    Skin:    General: Skin is warm.     Capillary Refill: Capillary refill takes less than 2 seconds.  Neurological:     General: No focal deficit present.     Mental Status: She is alert.     Sensory: No sensory deficit.     Motor: No weakness.     (all labs ordered are listed, but only abnormal results are displayed) Labs Reviewed - No data to display  EKG: None  Radiology: No results found.  {Document cardiac monitor, telemetry assessment procedure when appropriate:32947} Procedures   Medications Ordered in the ED - No data to display    {Click here for ABCD2, HEART and other calculators REFRESH Note before signing:1}                              Medical Decision Making Patient here with low back pain, right hip pain and pain radiating into her right leg to the level of her foot.  Pain worsening over the last few days and she had difficulty getting out of bed this morning.  She had inability to void this morning stating the last time she voided was 10 PM last evening.  She denies any recent injury or saddle anesthesias.  No fever or chills.  She has been having right hip pain for some time but states her right low back pain is new.  On initial exam, patient is tearful appears  uncomfortable she has some lower abdominal distention over her bladder.  She attempted to urinate here was unsuccessful.  Foley catheter was inserted and she quickly drained 400 cc of dark yellow urine.  Abdomen now feels soft and her pain has improved  Low back pain differential would include but not limited to sciatica, spinal stenosis, cauda equina, spinal abscess also considered but felt less likely in this setting and patient has been without any recent spinal procedures.   Amount and/or Complexity of Data Reviewed Labs: ordered.    Details: Labs show Radiology: ordered.    Details: MRI L-spine  Risk Prescription drug management.     {  Document critical care time when appropriate  Document review of labs and clinical decision tools ie CHADS2VASC2, etc  Document your independent review of radiology images and any outside records  Document your discussion with family members, caretakers and with consultants  Document social determinants of health affecting pt's care  Document your decision making why or why not admission, treatments were needed:32947:::1}   Final diagnoses:  None    ED Discharge Orders     None

## 2024-04-28 NOTE — Discharge Instructions (Signed)
 Take the medication as prescribed.  As we have discussed, you will need to follow-up with your orthopedic provider regarding your back pain.  You required a Foley catheter to be placed today because you were unable to urinate.  You will need to follow-up with urology to have the Foley catheter removed.  Return to emergency department for any new or worsening symptoms.

## 2024-04-28 NOTE — ED Notes (Signed)
 Standard drainage bag changed to leg bag. Education provided on care for urinary leg bag, patient expressed understanding.
